# Patient Record
Sex: Male | Born: 1970 | Race: Black or African American | Hispanic: No | Marital: Single | Smoking: Never smoker
Health system: Southern US, Community
[De-identification: ages and names within clinical notes are randomized; demographics above are authoritative.]

## PROBLEM LIST (undated history)

## (undated) DIAGNOSIS — E669 Obesity, unspecified: Secondary | ICD-10-CM

## (undated) DIAGNOSIS — G629 Polyneuropathy, unspecified: Secondary | ICD-10-CM

## (undated) DIAGNOSIS — I10 Essential (primary) hypertension: Secondary | ICD-10-CM

## (undated) HISTORY — PX: OTHER SURGICAL HISTORY: SHX169

---

## 2010-06-26 ENCOUNTER — Emergency Department: Payer: Self-pay | Admitting: Unknown Physician Specialty

## 2014-06-05 ENCOUNTER — Ambulatory Visit: Payer: Self-pay | Admitting: Internal Medicine

## 2014-06-05 LAB — CBC WITH DIFFERENTIAL/PLATELET
BASOS PCT: 0.5 %
Basophil #: 0 10*3/uL (ref 0.0–0.1)
EOS ABS: 0.1 10*3/uL (ref 0.0–0.7)
EOS PCT: 1.1 %
HCT: 46.1 % (ref 40.0–52.0)
HGB: 15.1 g/dL (ref 13.0–18.0)
LYMPHS ABS: 1.3 10*3/uL (ref 1.0–3.6)
Lymphocyte %: 15.7 %
MCH: 26.5 pg (ref 26.0–34.0)
MCHC: 32.7 g/dL (ref 32.0–36.0)
MCV: 81 fL (ref 80–100)
MONO ABS: 0.5 x10 3/mm (ref 0.2–1.0)
Monocyte %: 5.7 %
Neutrophil #: 6.1 10*3/uL (ref 1.4–6.5)
Neutrophil %: 77 %
PLATELETS: 154 10*3/uL (ref 150–440)
RBC: 5.68 10*6/uL (ref 4.40–5.90)
RDW: 13.9 % (ref 11.5–14.5)
WBC: 7.9 10*3/uL (ref 3.8–10.6)

## 2014-06-05 LAB — COMPREHENSIVE METABOLIC PANEL
AST: 28 U/L (ref 15–37)
Albumin: 3.9 g/dL (ref 3.4–5.0)
Alkaline Phosphatase: 67 U/L
Anion Gap: 6 — ABNORMAL LOW (ref 7–16)
BUN: 14 mg/dL (ref 7–18)
Bilirubin,Total: 0.7 mg/dL (ref 0.2–1.0)
CHLORIDE: 98 mmol/L (ref 98–107)
CO2: 32 mmol/L (ref 21–32)
Calcium, Total: 9.2 mg/dL (ref 8.5–10.1)
Creatinine: 0.8 mg/dL (ref 0.60–1.30)
EGFR (African American): >60
GLUCOSE: 119 mg/dL — AB (ref 65–99)
OSMOLALITY: 274 (ref 275–301)
Potassium: 4 mmol/L (ref 3.5–5.1)
SGPT (ALT): 34 U/L (ref 12–78)
SODIUM: 136 mmol/L (ref 136–145)
Total Protein: 9.1 g/dL — ABNORMAL HIGH (ref 6.4–8.2)

## 2014-06-05 LAB — URINALYSIS, COMPLETE
BILIRUBIN, UR: NEGATIVE
Bacteria: NEGATIVE
Blood: NEGATIVE
GLUCOSE, UR: NEGATIVE mg/dL (ref 0–75)
Ketone: NEGATIVE
Leukocyte Esterase: NEGATIVE
NITRITE: NEGATIVE
Ph: 6 (ref 4.5–8.0)
Protein: NEGATIVE
Specific Gravity: 1.01 (ref 1.003–1.030)

## 2014-06-05 LAB — MAGNESIUM: Magnesium: 1.7 mg/dL — ABNORMAL LOW

## 2017-06-06 ENCOUNTER — Ambulatory Visit (INDEPENDENT_AMBULATORY_CARE_PROVIDER_SITE_OTHER): Payer: BLUE CROSS/BLUE SHIELD

## 2017-06-06 ENCOUNTER — Ambulatory Visit
Admission: EM | Admit: 2017-06-06 | Discharge: 2017-06-06 | Disposition: A | Discharge status: Home / Self Care | Payer: BLUE CROSS/BLUE SHIELD | Attending: Family Medicine | Admitting: Family Medicine

## 2017-06-06 DIAGNOSIS — S2231XA Fracture of one rib, right side, initial encounter for closed fracture: Secondary | ICD-10-CM

## 2017-06-06 DIAGNOSIS — R0782 Intercostal pain: Secondary | ICD-10-CM

## 2017-06-06 HISTORY — DX: Essential (primary) hypertension: I10

## 2017-06-06 HISTORY — DX: Obesity, unspecified: E66.9

## 2017-06-06 LAB — URINALYSIS, COMPLETE (UACMP) WITH MICROSCOPIC
Bacteria, UA: NONE SEEN
Bilirubin Urine: NEGATIVE
Glucose, UA: NEGATIVE mg/dL
Hgb urine dipstick: NEGATIVE
Ketones, ur: NEGATIVE mg/dL
Leukocytes, UA: NEGATIVE
Nitrite: NEGATIVE
Protein, ur: NEGATIVE mg/dL
RBC / HPF: NONE SEEN RBC/hpf (ref 0–5)
Specific Gravity, Urine: 1.025 (ref 1.005–1.030)
Squamous Epithelial / LPF: NONE SEEN
pH: 5.5 (ref 5.0–8.0)

## 2017-06-06 MED ORDER — METAXALONE 800 MG PO TABS: 800.0000 mg | ORAL_TABLET | Freq: Three times a day (TID) | ORAL | 0 refills | Status: DC

## 2017-06-06 MED ORDER — NAPROXEN 500 MG PO TABS: 500.0000 mg | ORAL_TABLET | Freq: Two times a day (BID) | ORAL | 0 refills | Status: DC

## 2017-06-06 NOTE — ED Provider Notes (Signed)
CSN: [id number]     Arrival date & time 06/06/17  1191 History   First MD Initiated Contact with Patient 06/06/17 [phone]     Chief Complaint  Patient presents with  . Rib Injury    right side   (Consider location/radiation/quality/duration/timing/severity/associated sxs/prior Treatment) HPI  This is a 46 year old male who is morbidly obese complains of right sided lower rib pain on Sunday and is intensified over the next couple of days. It does not seem to radiate. He indicates that it is in the mid axillary line. He states it is much worse when he walks and certain motions.Turning over in bed is very painful.  He does not remember any specific injury that may have precipitated his pain. Characterizes as a stabbing pain. When he sits down and is quiet he gets some relief. Deep inspiration does not seem to worsen and he has had no difficulty with breathing.        Past Medical History:  Diagnosis Date  . Hypertension   . Obesity    History reviewed. No pertinent surgical history. Family History  Problem Relation Age of Onset  . Diabetes Father   . Hypertension Father    Social History  Substance Use Topics  . Smoking status: Never Smoker  . Smokeless tobacco: Never Used  . Alcohol use Yes    Review of Systems  Constitutional: Positive for activity change. Negative for appetite change, chills, fatigue and fever.  Respiratory: Negative for cough and shortness of breath.   Musculoskeletal: Positive for myalgias.  All other systems reviewed and are negative.   Allergies  Sulfa antibiotics  Home Medications   Prior to Admission medications   Medication Sig Start Date End Date Taking? Authorizing Provider  amLODipine-benazepril (LOTREL) 10-20 MG capsule Take 1 capsule by mouth daily.   Yes [provider]  metaxalone (SKELAXIN) 800 MG tablet Take 1 tablet (800 mg total) by mouth 3 (three) times daily. 06/06/17   Lutricia Feil, PA-C  naproxen (NAPROSYN) 500 MG  tablet Take 1 tablet (500 mg total) by mouth 2 (two) times daily with a meal. 06/06/17   Lutricia Feil, PA-C   Meds Ordered and Administered this Visit  Medications - No data to display  BP (!) 171/95 (BP Location: Left Arm)   Pulse 78   Temp 98.2 F (36.8 C) (Oral)   Resp 20   Ht 6\' 1"  (1.854 m)   Wt (!) 580 lb (263.1 kg)   SpO2 100%   BMI 76.52 kg/m  No data found.   Physical Exam  Constitutional: He is oriented to person, place, and time. He appears well-developed and well-nourished. No distress.  HENT:  Head: Normocephalic.  Eyes: Pupils are equal, round, and reactive to light.  Neck: Normal range of motion.  Musculoskeletal: Normal range of motion. He exhibits tenderness.  Mild tenderness is elicited over the lower rib cage in the mid to anterior axillary line. Left lateral flexion reproduces tenderness on the right lower ribs as does thoracic rotation. There is no CVA tenderness. The patient is difficult to examine due to the obesity.  Neurological: He is alert and oriented to person, place, and time.  Skin: Skin is warm and dry. He is not diaphoretic.  Psychiatric: He has a normal mood and affect. His behavior is normal. Judgment and thought content normal.  Nursing note and vitals reviewed.   Urgent Care Course     Procedures (including critical care time)  Labs Review Labs Reviewed  URINALYSIS, COMPLETE (UACMP) WITH MICROSCOPIC    Imaging Review Dg Ribs Unilateral W/chest Right  Result Date: 06/06/2017 CLINICAL DATA:  Right anterior rib pain.  No injury. EXAM: RIGHT RIBS AND CHEST - 3+ VIEW COMPARISON:  None. FINDINGS: Mild irregularity in the anterior right tenth rib may reflect nondisplaced fracture. No associated effusion or pneumothorax. Lungs are clear. Heart is normal size. IMPRESSION: Possible nondisplaced fracture in the anterior right tenth rib. Electronically Signed   By: Charlett NoseKevin  Dover M.D.   On: 06/06/2017 10:47     Visual Acuity Review  Right  Eye Distance:   Left Eye Distance:   Bilateral Distance:    Right Eye Near:   Left Eye Near:    Bilateral Near:         MDM   1. Closed fracture of one rib of right side, initial encounter    New Prescriptions   METAXALONE (SKELAXIN) 800 MG TABLET    Take 1 tablet (800 mg total) by mouth 3 (three) times daily.   NAPROXEN (NAPROSYN) 500 MG TABLET    Take 1 tablet (500 mg total) by mouth 2 (two) times daily with a meal.  Plan: 1. Test/x-ray results and diagnosis reviewed with patient 2. rx as per orders; risks, benefits, potential side effects reviewed with patient 3. Recommend supportive treatment with Rest and symptom avoidance. Patient was told he must cough and deep breathe frequently to prevent pneumonia or pneumonitis. If he continues to have problems he should follow-up with his primary care physician. 4. F/u prn if symptoms worsen or don't improve     Lutricia FeilRoemer, William P, PA-C 06/06/17 1123

## 2017-06-06 NOTE — ED Triage Notes (Signed)
Pt c/o right sided rib pain that started on Sunday and it intensified over the next couple of days. The pain is worse when he walks, its a stabbing pain on his side. Sitting down he gets some relief.

## 2017-06-10 ENCOUNTER — Telehealth: Payer: Self-pay | Admitting: *Deleted

## 2017-06-10 NOTE — Telephone Encounter (Signed)
Patient called requesting a detailed work note releasing him for duty as per the request of his employer. Patient was given a standard return to work note that did not state no restrictions. Informed patient that he would have to be re assessed by one of our providers since Pul[place]emer PA is not working today. Patient confirmed understanding of information.

## 2017-07-18 ENCOUNTER — Encounter: Payer: Self-pay | Admitting: Emergency Medicine

## 2017-07-18 ENCOUNTER — Ambulatory Visit
Admission: EM | Admit: 2017-07-18 | Discharge: 2017-07-18 | Disposition: A | Discharge status: Home / Self Care | Payer: BLUE CROSS/BLUE SHIELD | Attending: Family Medicine | Admitting: Family Medicine

## 2017-07-18 DIAGNOSIS — I10 Essential (primary) hypertension: Secondary | ICD-10-CM | POA: Diagnosis not present

## 2017-07-18 DIAGNOSIS — M62830 Muscle spasm of back: Secondary | ICD-10-CM | POA: Diagnosis not present

## 2017-07-18 MED ORDER — MELOXICAM 15 MG PO TABS: 15.0000 mg | ORAL_TABLET | Freq: Every day | ORAL | 1 refills | Status: DC

## 2017-07-18 MED ORDER — KETOROLAC TROMETHAMINE 60 MG/2ML IM SOLN: 60.0000 mg | Freq: Once | INTRAMUSCULAR | Status: DC

## 2017-07-18 MED ORDER — KETOROLAC TROMETHAMINE 60 MG/2ML IM SOLN
60.0000 mg | Freq: Once | INTRAMUSCULAR | Status: AC
  Administered 2017-07-18: 60 mg via INTRAMUSCULAR

## 2017-07-18 MED ORDER — METAXALONE 800 MG PO TABS: 800.0000 mg | ORAL_TABLET | Freq: Three times a day (TID) | ORAL | 0 refills | Status: DC

## 2017-07-18 NOTE — ED Provider Notes (Signed)
[facility]    CSN: [id number] Arrival date & time: 07/18/17  1034     History   Chief Complaint Chief Complaint  Patient presents with  . Back Pain    right sided    HPI Bryan Sloan is a [age] y.o. male.   Patient is obese black male who apparently fractured a rib couple weeks ago. Ribs fracture according to him was documented on x-ray. He states he fractured his ribs just was routine exercise. Reports for the several days rib is improving but about almost 2 weeks ago he went to a wedding since then he's has increased pain in the rib on the right side and mostly he feels his muscle spasms. He feels that he'll do something and his started having pain all along the right rib area in the back. He attributed to muscle spasms. By Friday Worse and over the weekend he continued to get worse until that he was unable to work for couple days last week. He states that he came to the office today because he still having pain the pain is improving a little bit but he still having some difficulty with the pain but he is able to walk 90 states. He states pain was so bad earlier this week he could not walk. Denies any shortness of breath doesn't believe he's had any new trauma to the ribs and also doesn't read or feel that the ribs needs to be re-x-rayed either. States some relief from muscle spasms.   The history is provided by the patient. No language interpreter was used.  Back Pain  Location:  Thoracic spine Quality:  Cramping Radiates to:  Does not radiate Pain severity:  Moderate Pain is:  Unable to specify Onset quality:  Sudden Duration:  7 days Timing:  Constant Progression:  Improving Chronicity:  New Relieved by:  Nothing Worsened by:  Nothing Ineffective treatments:  Ibuprofen, NSAIDs and OTC medications   Past Medical History:  Diagnosis Date  . Hypertension   . Obesity     There are no active problems to display for this patient.   History reviewed. No  pertinent surgical history.     Home Medications    Prior to Admission medications   Medication Sig Start Date End Date Taking? Authorizing Provider  amLODipine-benazepril (LOTREL) 10-20 MG capsule Take 1 capsule by mouth daily.    [provider]  meloxicam (MOBIC) 15 MG tablet Take 1 tablet (15 mg total) by mouth daily. Do not take with Naprosyn or Motrin 07/18/17   Hassan RowanWade, Charlisha Market, MD  metaxalone (SKELAXIN) 800 MG tablet Take 1 tablet (800 mg total) by mouth 3 (three) times daily. 07/18/17   Hassan RowanWade, Dorwin Fitzhenry, MD  naproxen (NAPROSYN) 500 MG tablet Take 1 tablet (500 mg total) by mouth 2 (two) times daily with a meal. 06/06/17   Lutricia Feiloemer, William P, PA-C    Family History Family History  Problem Relation Age of Onset  . Diabetes Father   . Hypertension Father     Social History Social History  Substance Use Topics  . Smoking status: Never Smoker  . Smokeless tobacco: Never Used  . Alcohol use Yes     Allergies   Sulfa antibiotics   Review of Systems Review of Systems  Musculoskeletal: Positive for back pain.  All other systems reviewed and are negative.    Physical Exam Triage Vital Signs ED Triage Vitals  Enc Vitals Group     BP 07/18/17 1100 (!) 164/99  Pulse Rate 07/18/17 1100 99     Resp 07/18/17 1100 16     Temp 07/18/17 1100 98.7 F (37.1 C)     Temp Source 07/18/17 1100 Oral     SpO2 07/18/17 1100 97 %     Weight 07/18/17 1058 (!) 590 lb (267.6 kg)     Height 07/18/17 1058 6\' 1"  (1.854 m)     Head Circumference --      Peak Flow --      Pain Score 07/18/17 1058 2     Pain Loc --      Pain Edu? --      Excl. in GC? --    No data found.   Updated Vital Signs BP (!) 164/99 (BP Location: Left Arm)   Pulse 99   Temp 98.7 F (37.1 C) (Oral)   Resp 16   Ht 6\' 1"  (1.854 m)   Wt (!) 590 lb (267.6 kg)   SpO2 97%   BMI 77.84 kg/m   Visual Acuity Right Eye Distance:   Left Eye Distance:   Bilateral Distance:    Right Eye Near:   Left Eye  Near:    Bilateral Near:     Physical Exam  Constitutional: He is oriented to person, place, and time.  Non-toxic appearance. He does not have a sickly appearance. He does not appear ill. No distress.  Patient is a morbidly obese black male with hypertension and obesity  HENT:  Head: Normocephalic and atraumatic.  Right Ear: External ear normal.  Left Ear: External ear normal.  Eyes: Pupils are equal, round, and reactive to light. EOM are normal.  Neck: Normal range of motion.  Pulmonary/Chest: Effort normal.  Musculoskeletal: He exhibits tenderness.       Thoracic back: He exhibits spasm.       Back:       Arms: Neurological: He is alert and oriented to person, place, and time.  Skin: Skin is warm.  Psychiatric: He has a normal mood and affect.  Vitals reviewed.    UC Treatments / Results  Labs (all labs ordered are listed, but only abnormal results are displayed) Labs Reviewed - No data to display  EKG  EKG Interpretation None       Radiology No results found.  Procedures Procedures (including critical care time)  Medications Ordered in UC Medications  ketorolac (TORADOL) injection 60 mg (not administered)  ketorolac (TORADOL) injection 60 mg (not administered)     Initial Impression / Assessment and Plan / UC Course  I have reviewed the triage vital signs and the nursing notes.  Pertinent labs & imaging results that were available during my care of the patient were reviewed by me and considered in my medical decision making (see chart for details).   patient will be given 60 Toradol for the muscle spasm renew his Skelaxin 800 mg 3 times a day and place him on Mobic 15 mg of Naprosyn or Motrin follow-up with PCP if not better in a week. He also reports that he needed clearance for work as far as no strictures tried explained to him that I wouldn't going to put him on any restrictions anyway but I gave him a note for work inside. Half feel that there is no  restrictions on him.  Final Clinical Impressions(s) / UC Diagnoses   Final diagnoses:  Muscle spasm of back    New Prescriptions New Prescriptions   MELOXICAM (MOBIC) 15 MG TABLET    Take  1 tablet (15 mg total) by mouth daily. Do not take with Naprosyn or Motrin    Note: This dictation was prepared with Dragon dictation along with smaller phrase technology. Any transcriptional errors that result from this process are unintentional.   Hassan RowanWade, Jonelle Bann, MD 07/18/17 1201

## 2017-07-18 NOTE — ED Notes (Signed)
Patient shows no signs of adverse reaction to medication at this time.  

## 2017-07-18 NOTE — ED Triage Notes (Signed)
Patient reports history of fractured rib on the right side of the chest.  Patient c/o right side pain that radiates to his back last Wed. Patient also reports muscle spasms.

## 2019-01-11 IMAGING — CR DG RIBS W/ CHEST 3+V*R*
4 series · 4 of 4 positions shown · non-contrast
Comparison: None.

CLINICAL DATA: Right anterior rib pain.  No injury.

EXAM:
RIGHT RIBS AND CHEST - 3+ VIEW

[chest pa (1 of 2)]
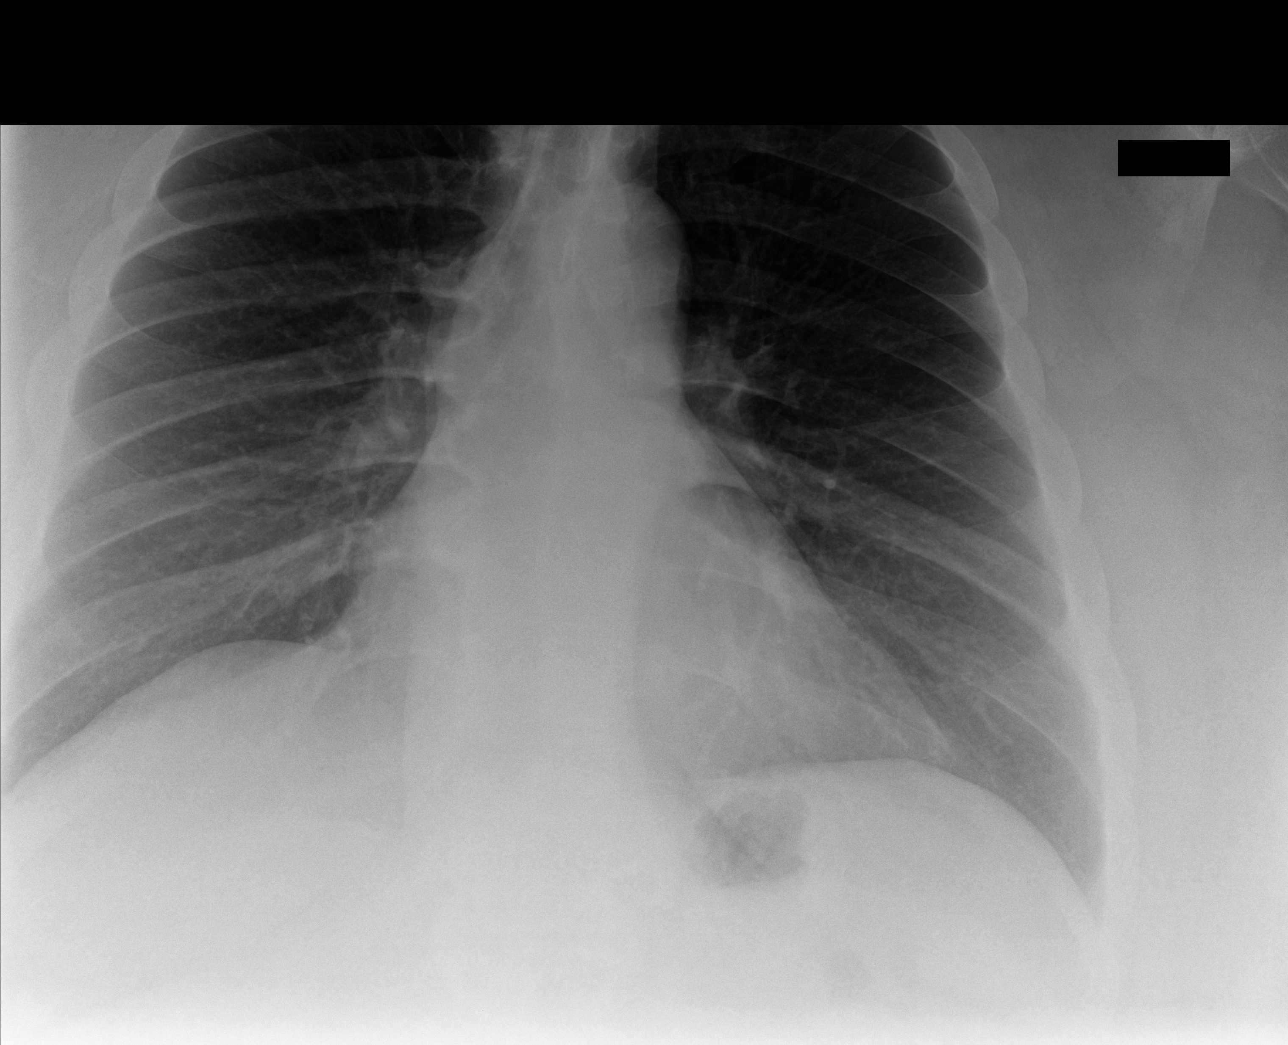

[rib pa]
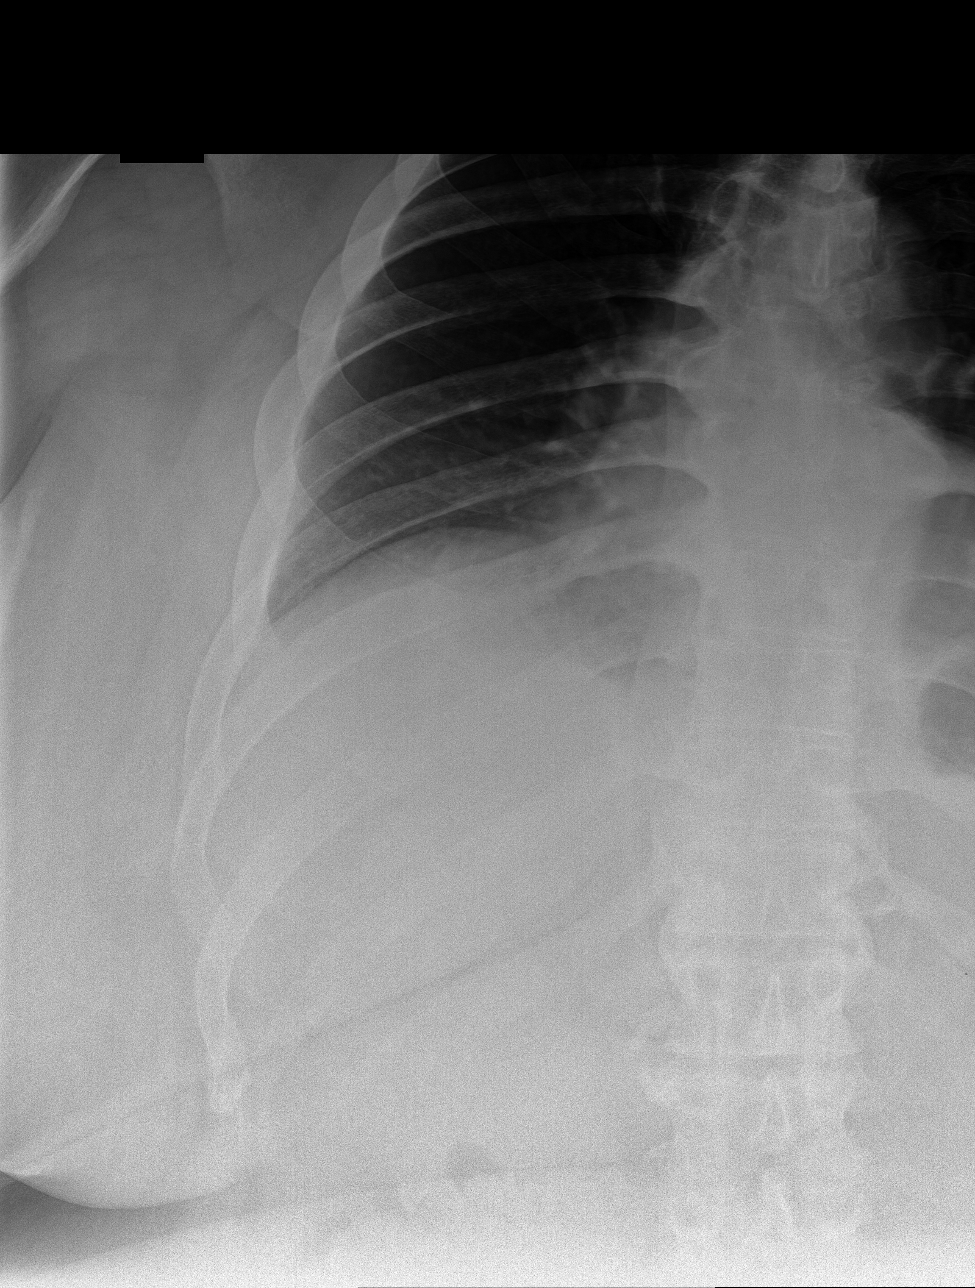

[rib obl]
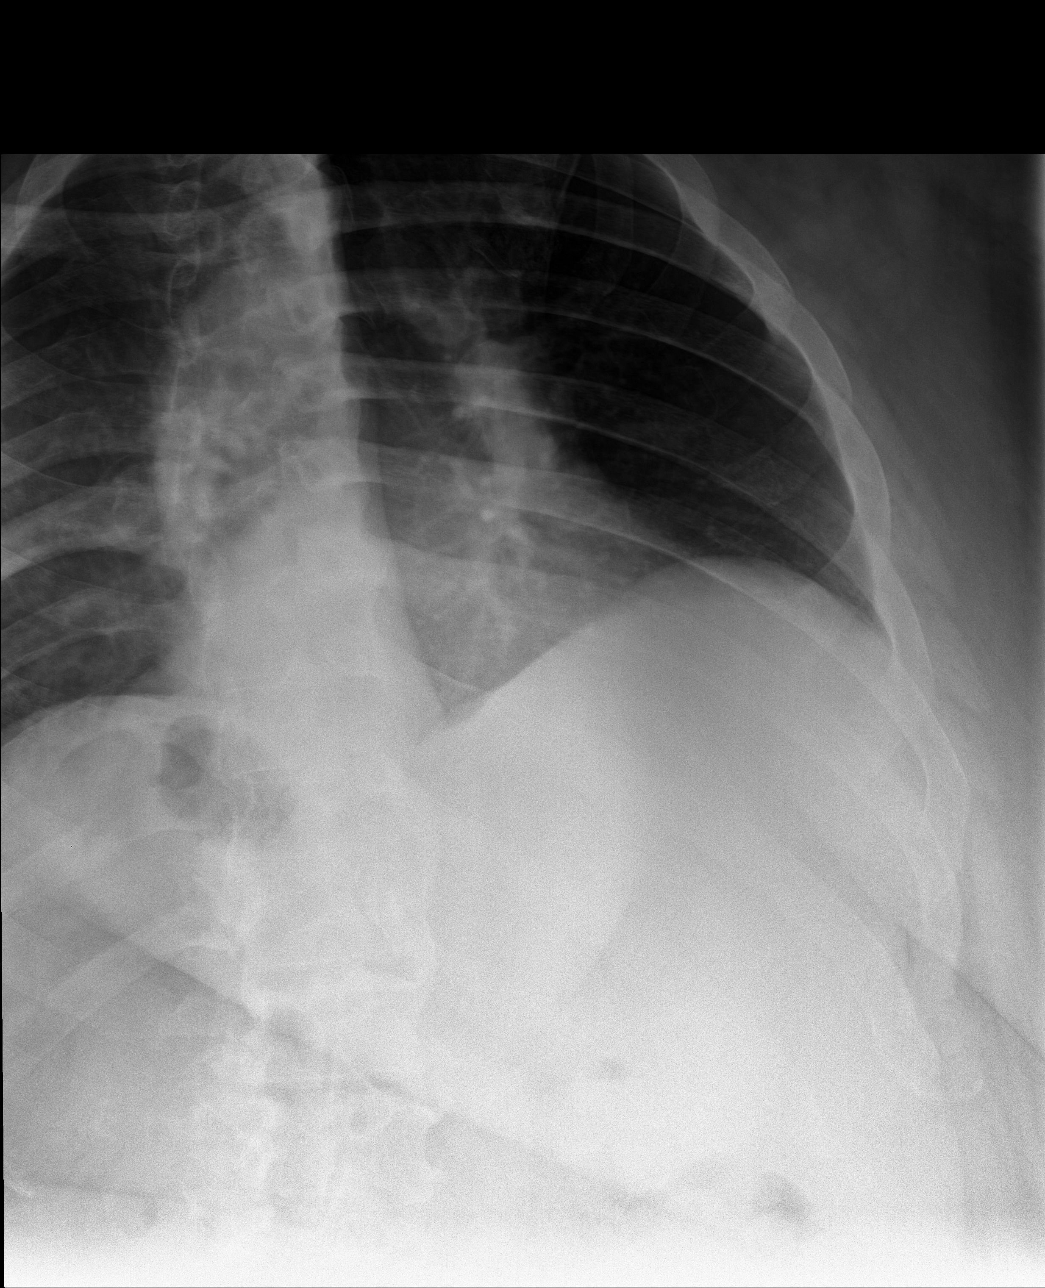

[chest pa (2 of 2)]
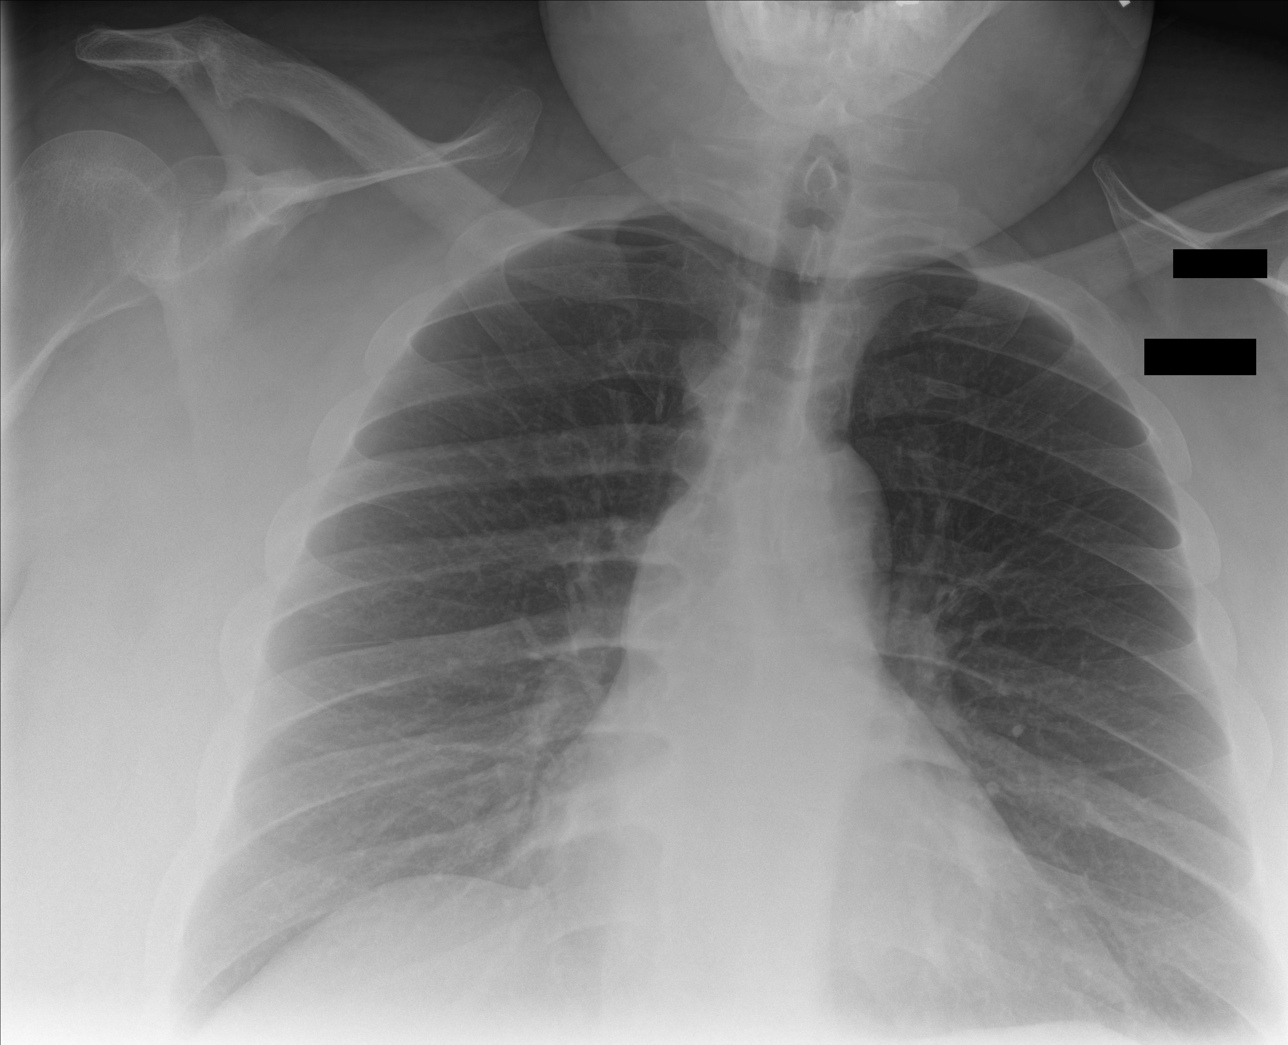

[4 of 4 positions shown; findings below may reference images not displayed]

FINDINGS: Mild irregularity in the anterior right tenth rib may reflect
nondisplaced fracture. No associated effusion or pneumothorax. Lungs
are clear. Heart is normal size.
IMPRESSION: Possible nondisplaced fracture in the anterior right tenth rib.

## 2022-02-02 ENCOUNTER — Ambulatory Visit: Payer: Self-pay | Admitting: Family Medicine

## 2022-03-05 ENCOUNTER — Ambulatory Visit: Payer: Self-pay | Admitting: Family Medicine

## 2024-12-26 ENCOUNTER — Emergency Department

## 2024-12-26 ENCOUNTER — Inpatient Hospital Stay
Admission: EM | Admit: 2024-12-26 | Discharge: 2024-12-31 | DRG: 189 | Disposition: A | Discharge status: Home / Self Care | Attending: Internal Medicine | Admitting: Internal Medicine

## 2024-12-26 ENCOUNTER — Other Ambulatory Visit: Payer: Self-pay

## 2024-12-26 DIAGNOSIS — E662 Morbid (severe) obesity with alveolar hypoventilation: Secondary | ICD-10-CM | POA: Diagnosis present

## 2024-12-26 DIAGNOSIS — I129 Hypertensive chronic kidney disease with stage 1 through stage 4 chronic kidney disease, or unspecified chronic kidney disease: Secondary | ICD-10-CM | POA: Diagnosis present

## 2024-12-26 DIAGNOSIS — Z7982 Long term (current) use of aspirin: Secondary | ICD-10-CM

## 2024-12-26 DIAGNOSIS — I739 Peripheral vascular disease, unspecified: Secondary | ICD-10-CM | POA: Diagnosis present

## 2024-12-26 DIAGNOSIS — R000 Tachycardia, unspecified: Secondary | ICD-10-CM | POA: Diagnosis present

## 2024-12-26 DIAGNOSIS — E872 Acidosis, unspecified: Secondary | ICD-10-CM | POA: Diagnosis present

## 2024-12-26 DIAGNOSIS — I2489 Other forms of acute ischemic heart disease: Secondary | ICD-10-CM | POA: Diagnosis present

## 2024-12-26 DIAGNOSIS — I10 Essential (primary) hypertension: Secondary | ICD-10-CM | POA: Diagnosis not present

## 2024-12-26 DIAGNOSIS — Z79899 Other long term (current) drug therapy: Secondary | ICD-10-CM

## 2024-12-26 DIAGNOSIS — R079 Chest pain, unspecified: Principal | ICD-10-CM

## 2024-12-26 DIAGNOSIS — I5A Non-ischemic myocardial injury (non-traumatic): Secondary | ICD-10-CM | POA: Diagnosis present

## 2024-12-26 DIAGNOSIS — E66813 Obesity, class 3: Secondary | ICD-10-CM

## 2024-12-26 DIAGNOSIS — Z713 Dietary counseling and surveillance: Secondary | ICD-10-CM

## 2024-12-26 DIAGNOSIS — R0902 Hypoxemia: Secondary | ICD-10-CM

## 2024-12-26 DIAGNOSIS — Z23 Encounter for immunization: Secondary | ICD-10-CM

## 2024-12-26 DIAGNOSIS — I480 Paroxysmal atrial fibrillation: Secondary | ICD-10-CM | POA: Diagnosis present

## 2024-12-26 DIAGNOSIS — G629 Polyneuropathy, unspecified: Secondary | ICD-10-CM | POA: Diagnosis present

## 2024-12-26 DIAGNOSIS — Z6841 Body Mass Index (BMI) 40.0 and over, adult: Secondary | ICD-10-CM

## 2024-12-26 DIAGNOSIS — E876 Hypokalemia: Secondary | ICD-10-CM | POA: Diagnosis not present

## 2024-12-26 DIAGNOSIS — Z8249 Family history of ischemic heart disease and other diseases of the circulatory system: Secondary | ICD-10-CM

## 2024-12-26 DIAGNOSIS — R7989 Other specified abnormal findings of blood chemistry: Secondary | ICD-10-CM | POA: Diagnosis not present

## 2024-12-26 DIAGNOSIS — J9601 Acute respiratory failure with hypoxia: Principal | ICD-10-CM | POA: Diagnosis present

## 2024-12-26 DIAGNOSIS — E871 Hypo-osmolality and hyponatremia: Secondary | ICD-10-CM | POA: Diagnosis present

## 2024-12-26 DIAGNOSIS — Z1152 Encounter for screening for COVID-19: Secondary | ICD-10-CM

## 2024-12-26 DIAGNOSIS — N182 Chronic kidney disease, stage 2 (mild): Secondary | ICD-10-CM | POA: Diagnosis present

## 2024-12-26 DIAGNOSIS — I959 Hypotension, unspecified: Secondary | ICD-10-CM | POA: Diagnosis present

## 2024-12-26 DIAGNOSIS — Z791 Long term (current) use of non-steroidal anti-inflammatories (NSAID): Secondary | ICD-10-CM

## 2024-12-26 DIAGNOSIS — Z833 Family history of diabetes mellitus: Secondary | ICD-10-CM

## 2024-12-26 HISTORY — DX: Polyneuropathy, unspecified: G62.9

## 2024-12-26 HISTORY — DX: Morbid (severe) obesity due to excess calories: E66.01

## 2024-12-26 LAB — RESP PANEL BY RT-PCR (RSV, FLU A&B, COVID)  RVPGX2
Influenza A by PCR: NEGATIVE
Influenza B by PCR: NEGATIVE
Resp Syncytial Virus by PCR: NEGATIVE
SARS Coronavirus 2 by RT PCR: NEGATIVE

## 2024-12-26 LAB — CBC WITH DIFFERENTIAL/PLATELET
Abs Immature Granulocytes: 0.03 K/uL (ref 0.00–0.07)
Basophils Absolute: 0 K/uL (ref 0.0–0.1)
Basophils Relative: 0 %
Eosinophils Absolute: 0 K/uL (ref 0.0–0.5)
Eosinophils Relative: 0 %
HCT: 44.5 % (ref 39.0–52.0)
Hemoglobin: 14.4 g/dL (ref 13.0–17.0)
Immature Granulocytes: 0 %
Lymphocytes Relative: 21 %
Lymphs Abs: 1.5 K/uL (ref 0.7–4.0)
MCH: 26 pg (ref 26.0–34.0)
MCHC: 32.4 g/dL (ref 30.0–36.0)
MCV: 80.5 fL (ref 80.0–100.0)
Monocytes Absolute: 0.7 K/uL (ref 0.1–1.0)
Monocytes Relative: 10 %
Neutro Abs: 4.9 K/uL (ref 1.7–7.7)
Neutrophils Relative %: 69 %
Platelets: 200 K/uL (ref 150–400)
RBC: 5.53 MIL/uL (ref 4.22–5.81)
RDW: 13.2 % (ref 11.5–15.5)
WBC: 7.2 K/uL (ref 4.0–10.5)
nRBC: 0 % (ref 0.0–0.2)

## 2024-12-26 LAB — T4, FREE: Free T4: 1.35 ng/dL (ref 0.80–2.00)

## 2024-12-26 LAB — PROTIME-INR
INR: 1 (ref 0.8–1.2)
Prothrombin Time: 14 s (ref 11.4–15.2)

## 2024-12-26 LAB — APTT: aPTT: 27 s (ref 24–36)

## 2024-12-26 LAB — COMPREHENSIVE METABOLIC PANEL WITH GFR
ALT: 18 U/L (ref 0–44)
AST: 40 U/L (ref 15–41)
Albumin: 4 g/dL (ref 3.5–5.0)
Alkaline Phosphatase: 59 U/L (ref 38–126)
Anion gap: 18 — ABNORMAL HIGH (ref 5–15)
BUN: 18 mg/dL (ref 6–20)
CO2: 24 mmol/L (ref 22–32)
Calcium: 9.5 mg/dL (ref 8.9–10.3)
Chloride: 90 mmol/L — ABNORMAL LOW (ref 98–111)
Creatinine, Ser: 1.21 mg/dL (ref 0.61–1.24)
GFR, Estimated: >60 mL/min
Glucose, Bld: 162 mg/dL — ABNORMAL HIGH (ref 70–99)
Potassium: 3.3 mmol/L — ABNORMAL LOW (ref 3.5–5.1)
Sodium: 132 mmol/L — ABNORMAL LOW (ref 135–145)
Total Bilirubin: 1 mg/dL (ref 0.0–1.2)
Total Protein: 8.7 g/dL — ABNORMAL HIGH (ref 6.5–8.1)

## 2024-12-26 LAB — BLOOD GAS, VENOUS
Acid-Base Excess: 3.2 mmol/L — ABNORMAL HIGH (ref 0.0–2.0)
Bicarbonate: 28.5 mmol/L — ABNORMAL HIGH (ref 20.0–28.0)
O2 Saturation: 69.5 %
Patient temperature: 37
pCO2, Ven: 45 mmHg (ref 44–60)
pH, Ven: 7.41 (ref 7.25–7.43)
pO2, Ven: 44 mmHg (ref 32–45)

## 2024-12-26 LAB — TROPONIN T, HIGH SENSITIVITY
Troponin T High Sensitivity: 23 ng/L — ABNORMAL HIGH (ref 0–19)
Troponin T High Sensitivity: 27 ng/L — ABNORMAL HIGH (ref 0–19)
Troponin T High Sensitivity: 27 ng/L — ABNORMAL HIGH (ref 0–19)

## 2024-12-26 LAB — TSH: TSH: 3.39 u[IU]/mL (ref 0.350–4.500)

## 2024-12-26 LAB — LACTIC ACID, PLASMA
Lactic Acid, Venous: 4.6 mmol/L (ref 0.5–1.9)
Lactic Acid, Venous: 1.9 mmol/L (ref 0.5–1.9)
Lactic Acid, Venous: 1.5 mmol/L (ref 0.5–1.9)

## 2024-12-26 LAB — LIPASE, BLOOD: Lipase: 16 U/L (ref 11–51)

## 2024-12-26 LAB — PRO BRAIN NATRIURETIC PEPTIDE: Pro Brain Natriuretic Peptide: <50 pg/mL

## 2024-12-26 LAB — D-DIMER, QUANTITATIVE: D-Dimer, Quant: 1.16 ug{FEU}/mL — ABNORMAL HIGH (ref 0.00–0.50)

## 2024-12-26 LAB — MAGNESIUM: Magnesium: 1.9 mg/dL (ref 1.7–2.4)

## 2024-12-26 MED ORDER — DM-GUAIFENESIN ER 30-600 MG PO TB12: 1.0000 | ORAL_TABLET | Freq: Two times a day (BID) | ORAL | Status: DC | PRN

## 2024-12-26 MED ORDER — SODIUM CHLORIDE 0.9 % IV BOLUS: 250.0000 mL | Freq: Once | INTRAVENOUS | Status: DC

## 2024-12-26 MED ORDER — LORAZEPAM 1 MG PO TABS
1.0000 mg | ORAL_TABLET | Freq: Once | ORAL | Status: AC
  Administered 2024-12-26: 1 mg via ORAL
  Filled 2024-12-26: qty 1

## 2024-12-26 MED ORDER — ACETAMINOPHEN 325 MG PO TABS
650.0000 mg | ORAL_TABLET | Freq: Four times a day (QID) | ORAL | Status: DC | PRN
  Administered 2024-12-29: 650 mg via ORAL
  Filled 2024-12-26: qty 2

## 2024-12-26 MED ORDER — NAPROXEN 500 MG PO TABS
500.0000 mg | ORAL_TABLET | Freq: Two times a day (BID) | ORAL | Status: DC
  Administered 2024-12-27 – 2024-12-31 (×9): 500 mg via ORAL
  Filled 2024-12-26 (×10): qty 1

## 2024-12-26 MED ORDER — SODIUM CHLORIDE 0.9 % IV SOLN: INTRAVENOUS | Status: DC

## 2024-12-26 MED ORDER — ASPIRIN 81 MG PO CHEW: 324.0000 mg | CHEWABLE_TABLET | Freq: Once | ORAL | Status: DC

## 2024-12-26 MED ORDER — MORPHINE SULFATE (PF) 4 MG/ML IV SOLN: 4.0000 mg | INTRAVENOUS | Status: DC | PRN

## 2024-12-26 MED ORDER — POTASSIUM CHLORIDE CRYS ER 20 MEQ PO TBCR
40.0000 meq | EXTENDED_RELEASE_TABLET | Freq: Once | ORAL | Status: AC
  Administered 2024-12-26: 40 meq via ORAL
  Filled 2024-12-26: qty 2

## 2024-12-26 MED ORDER — ENOXAPARIN SODIUM 120 MG/0.8ML IJ SOSY
120.0000 mg | PREFILLED_SYRINGE | INTRAMUSCULAR | Status: DC
  Administered 2024-12-26: 120 mg via SUBCUTANEOUS
  Filled 2024-12-26: qty 0.8

## 2024-12-26 MED ORDER — ONDANSETRON HCL 4 MG/2ML IJ SOLN: 4.0000 mg | Freq: Three times a day (TID) | INTRAMUSCULAR | Status: DC | PRN

## 2024-12-26 MED ORDER — HYDRALAZINE HCL 20 MG/ML IJ SOLN: 5.0000 mg | INTRAMUSCULAR | Status: DC | PRN

## 2024-12-26 MED ORDER — MAGNESIUM SULFATE 2 GM/50ML IV SOLN
2.0000 g | Freq: Once | INTRAVENOUS | Status: AC
  Administered 2024-12-26: 2 g via INTRAVENOUS
  Filled 2024-12-26: qty 50

## 2024-12-26 MED ORDER — ASPIRIN 81 MG PO TBEC
81.0000 mg | DELAYED_RELEASE_TABLET | Freq: Every day | ORAL | Status: DC
  Administered 2024-12-27 – 2024-12-31 (×5): 81 mg via ORAL
  Filled 2024-12-26 (×5): qty 1

## 2024-12-26 MED ORDER — OXYCODONE-ACETAMINOPHEN 5-325 MG PO TABS
1.0000 | ORAL_TABLET | ORAL | Status: DC | PRN
  Administered 2024-12-29 – 2024-12-31 (×5): 1 via ORAL
  Filled 2024-12-26 (×5): qty 1

## 2024-12-26 MED ORDER — ALBUTEROL SULFATE (2.5 MG/3ML) 0.083% IN NEBU
2.5000 mg | INHALATION_SOLUTION | RESPIRATORY_TRACT | Status: DC | PRN
  Filled 2024-12-26: qty 3

## 2024-12-26 MED ORDER — LACTATED RINGERS IV BOLUS
1000.0000 mL | Freq: Once | INTRAVENOUS | Status: AC
  Administered 2024-12-26: 1000 mL via INTRAVENOUS

## 2024-12-26 NOTE — ED Notes (Signed)
 2 sets of blood cultures, purple, blue, grey, SST, Lt green, green,  and VBG sent to lab

## 2024-12-26 NOTE — ED Notes (Signed)
 [phone]. NIVEA wants to be contacted for updates. Coming back in the morning.

## 2024-12-26 NOTE — ED Notes (Signed)
 Nitropaste removed per Nicholaus. MD verbal orders

## 2024-12-26 NOTE — ED Notes (Signed)
 ED Provider at bedside.

## 2024-12-26 NOTE — ED Triage Notes (Signed)
 BIB [facility]. Called ems for SOB and angina. Afib RVR 110-200, no HX. NO PCP. NO formal DX other than HTN. 1 1/2 inch of nitro paste to LUC 170/110, P110/200, CBG 116, Afebrile.

## 2024-12-26 NOTE — ED Notes (Signed)
 Pt advised he was very uncomfortable in the bed. Pt was transitioned to a bariatric recliner. Pt was removed from oxygen during this transition. Pt O2 dropped to 88% on RA.

## 2024-12-26 NOTE — H&P (Addendum)
 " History and Physical    Bryan Sloan FMW:[id number] DOB: 11-24-1971 DOA: 12/26/2024  Referring MD/NP/PA:   PCP: Patient, No Pcp Per   Patient coming from:  The patient is coming from home.     Chief Complaint: SOB  HPI: Bryan Sloan is a 54 y.o. male with medical history significant of morbid obesity with BMI 96.98 (BW 333.4 KG), HTN, peripheral neuropathy, who presents with SOB.  Patient states that he has chronic exertional SOB at baseline, but his SOB has worsened today.  He also reports lightheadedness.  Patient has difficulty speaking in full sentence. Patient does not use oxygen at baseline, but was found to have acute respiratory distress with oxygen desaturation to 88% on room air, which improved to 94% on 4L oxygen.  Patient reports chest tightness which has resolved.  Denies active chest pain.  No cough, fever or chills.  No nausea, vomiting, diarrhea or abdominal pain.  No symptoms UTI.  No tenderness in the calf areas.  No recent fall or head injury.  No rectal bleeding or dark stool.  Per EMS report, pt had A fib with HR 110-200s, but no strips were available from EMS. 3 EKGs were done in ED, all showed sinus tachycardia with PAC. Pt did not have hypotension initially, but blood pressure dropped to 77/55 after giving 1.5 inch of nitroglycerin paste to left upper chest. The nitroglycerin paste is removed.  Patient was given 1 L LR bolus in ED, blood pressure improved to 109/54. Pt received 324 mg of ASA from EMS.   Data reviewed independently and ED Course: pt was found to have trop 23 --> 27, proBNP <50, WBC 7.2, GFR> 60, potassium 3.3, INR 1.0, PTT 27, negative PCR for COVID, flu and RSV, lactic acid 4.6.  Temperature normal, heart rate 114 --> 90s, RR 18.  VBG with pH 7.41, CO2 45, O2 44.  Chest x-ray showed cardiomegaly and a negative for infiltration.  Patient is placed in PCU for observation.  Lower extremity venous Doppler: 1. No evidence of deep venous thrombosis. 2.  Slightly limited examination due to body habitus with relatively poor visualization of the infrapopliteal vasculature.     EKG: I have personally reviewed.  3 EKG was done in ED.  Sinus rhythm, early R wave progression, PAC, nonspecific T wave change.   Review of Systems:   General: no fevers, chills, no body weight gain, has fatigue HEENT: no blurry vision, hearing changes or sore throat Respiratory: has dyspnea, no coughing, wheezing CV: Has chest tightness, no palpitations GI: no nausea, vomiting, abdominal pain, diarrhea, constipation GU: no dysuria, burning on urination, increased urinary frequency, hematuria  Ext: has trace leg edema Neuro: no unilateral weakness, numbness, or tingling, no vision change or hearing loss Skin: no rash, no skin tear. MSK: No muscle spasm, no deformity, no limitation of range of movement in spin Heme: No easy bruising.  Travel history: No recent long distant travel.   Allergy: Allergies[1]  Past Medical History:  Diagnosis Date   Hypertension    Morbid obesity (HCC)    Peripheral neuropathy     Past Surgical History:  Procedure Laterality Date   Left foot wart      Social History:  reports that he has never smoked. He has never used smokeless tobacco. He reports current alcohol use. He reports that he does not use drugs.  Family History:  Family History  Problem Relation Age of Onset   Diabetes Father  Hypertension Father      Prior to Admission medications  Medication Sig Start Date End Date Taking? Authorizing Provider  amLODipine-benazepril (LOTREL) 10-20 MG capsule Take 1 capsule by mouth daily.    [provider]  meloxicam  (MOBIC ) 15 MG tablet Take 1 tablet (15 mg total) by mouth daily. Do not take with Naprosyn  or Motrin 07/18/17   Desiderio Beagle, MD  metaxalone  (SKELAXIN ) 800 MG tablet Take 1 tablet (800 mg total) by mouth 3 (three) times daily. 07/18/17   Desiderio Beagle, MD  naproxen  (NAPROSYN ) 500 MG tablet Take 1  tablet (500 mg total) by mouth 2 (two) times daily with a meal. 06/06/17   Lacinda Elsie SQUIBB, PA-C    Physical Exam: Vitals:   12/26/24 1630 12/26/24 1700 12/26/24 1730 12/26/24 1953  BP: (!) 104/59 (!) 77/55 (!) 109/54   Pulse: (!) 107 99 (!) 108   Resp: 15 15 18    Temp:    98 F (36.7 C)  TempSrc:    Axillary  SpO2: 99% 94% 91%   Weight:       General: Has moderate acute respiratory distress, morbidly obese HEENT:       Eyes: PERRL, EOMI, no jaundice       ENT: No discharge from the ears and nose, no pharynx injection, no tonsillar enlargement.        Neck: No JVD, no bruit, no mass felt. Heme: No neck lymph node enlargement. Cardiac: S1/S2, RRR, No murmurs, No gallops or rubs. Respiratory: No rales, wheezing, rhonchi or rubs. GI: Soft, nondistended, nontender, no rebound pain, no organomegaly, BS present. GU: No hematuria Ext: has trace leg edema bilaterally. 1+DP/PT pulse bilaterally. Musculoskeletal: No joint deformities, No joint redness or warmth, no limitation of ROM in spin. Skin: No rashes.  Neuro: Alert, oriented X3, cranial nerves II-XII grossly intact, moves all extremities normally. Psych: Patient is not psychotic, no suicidal or hemocidal ideation.  Labs on Admission: I have personally reviewed following labs and imaging studies  CBC: Recent Labs  Lab 12/26/24 1603  WBC 7.2  NEUTROABS 4.9  HGB 14.4  HCT 44.5  MCV 80.5  PLT 200   Basic Metabolic Panel: Recent Labs  Lab 12/26/24 1603  NA 132*  K 3.3*  CL 90*  CO2 24  GLUCOSE 162*  BUN 18  CREATININE 1.21  CALCIUM 9.5  MG 1.9   GFR: CrCl cannot be calculated (Unknown ideal weight.). Liver Function Tests: Recent Labs  Lab 12/26/24 1603  AST 40  ALT 18  ALKPHOS 59  BILITOT 1.0  PROT 8.7*  ALBUMIN 4.0   Recent Labs  Lab 12/26/24 1603  LIPASE 16   No results for input(s): AMMONIA in the last 168 hours. Coagulation Profile: Recent Labs  Lab 12/26/24 1603  INR 1.0   Cardiac  Enzymes: No results for input(s): CKTOTAL, CKMB, CKMBINDEX, TROPONINI in the last 168 hours. BNP (last 3 results) Recent Labs    12/26/24 1603  PROBNP <50.0   HbA1C: No results for input(s): HGBA1C in the last 72 hours. CBG: No results for input(s): GLUCAP in the last 168 hours. Lipid Profile: No results for input(s): CHOL, HDL, LDLCALC, TRIG, CHOLHDL, LDLDIRECT in the last 72 hours. Thyroid Function Tests: Recent Labs    12/26/24 1603  TSH 3.390  FREET4 1.35   Anemia Panel: No results for input(s): VITAMINB12, FOLATE, FERRITIN, TIBC, IRON, RETICCTPCT in the last 72 hours. Urine analysis:    Component Value Date/Time   COLORURINE YELLOW 06/06/2017 1018  APPEARANCEUR CLEAR 06/06/2017 1018   APPEARANCEUR CLEAR 06/05/2014 1143   LABSPEC 1.025 06/06/2017 1018   LABSPEC 1.010 06/05/2014 1143   PHURINE 5.5 06/06/2017 1018   GLUCOSEU NEGATIVE 06/06/2017 1018   GLUCOSEU NEGATIVE 06/05/2014 1143   HGBUR NEGATIVE 06/06/2017 1018   BILIRUBINUR NEGATIVE 06/06/2017 1018   BILIRUBINUR NEGATIVE 06/05/2014 1143   KETONESUR NEGATIVE 06/06/2017 1018   PROTEINUR NEGATIVE 06/06/2017 1018   NITRITE NEGATIVE 06/06/2017 1018   LEUKOCYTESUR NEGATIVE 06/06/2017 1018   LEUKOCYTESUR NEGATIVE 06/05/2014 1143   Sepsis Labs: @LABRCNTIP (procalcitonin:4,lacticidven:4) ) Recent Results (from the past 240 hours)  Resp panel by RT-PCR (RSV, Flu A&B, Covid) Anterior Nasal Swab     Status: None   Collection Time: 12/26/24  4:03 PM   Specimen: Anterior Nasal Swab  Result Value Ref Range Status   SARS Coronavirus 2 by RT PCR NEGATIVE NEGATIVE Final    Comment: (NOTE) SARS-CoV-2 target nucleic acids are NOT DETECTED.  The SARS-CoV-2 RNA is generally detectable in upper respiratory specimens during the acute phase of infection. The lowest concentration of SARS-CoV-2 viral copies this assay can detect is 138 copies/mL. A negative result does not preclude  SARS-Cov-2 infection and should not be used as the sole basis for treatment or other patient management decisions. A negative result may occur with  improper specimen collection/handling, submission of specimen other than nasopharyngeal swab, presence of viral mutation(s) within the areas targeted by this assay, and inadequate number of viral copies(<138 copies/mL). A negative result must be combined with clinical observations, patient history, and epidemiological information. The expected result is Negative.  Fact Sheet for Patients:  bloggercourse.com  Fact Sheet for Healthcare Providers:  seriousbroker.it  This test is no t yet approved or cleared by the [place]  FDA and  has been authorized for detection and/or diagnosis of SARS-CoV-2 by FDA under an Emergency Use Authorization (EUA). This EUA will remain  in effect (meaning this test can be used) for the duration of the COVID-19 declaration under Section 564(b)(1) of the Act, 21 U.S.C.section 360bbb-3(b)(1), unless the authorization is terminated  or revoked sooner.       Influenza A by PCR NEGATIVE NEGATIVE Final   Influenza B by PCR NEGATIVE NEGATIVE Final    Comment: (NOTE) The Xpert Xpress SARS-CoV-2/FLU/RSV plus assay is intended as an aid in the diagnosis of influenza from Nasopharyngeal swab specimens and should not be used as a sole basis for treatment. Nasal washings and aspirates are unacceptable for Xpert Xpress SARS-CoV-2/FLU/RSV testing.  Fact Sheet for Patients: bloggercourse.com  Fact Sheet for Healthcare Providers: seriousbroker.it  This test is not yet approved or cleared by the [place]  FDA and has been authorized for detection and/or diagnosis of SARS-CoV-2 by FDA under an Emergency Use Authorization (EUA). This EUA will remain in effect (meaning this test can be used) for the duration of  the COVID-19 declaration under Section 564(b)(1) of the Act, 21 U.S.C. section 360bbb-3(b)(1), unless the authorization is terminated or revoked.     Resp Syncytial Virus by PCR NEGATIVE NEGATIVE Final    Comment: (NOTE) Fact Sheet for Patients: bloggercourse.com  Fact Sheet for Healthcare Providers: seriousbroker.it  This test is not yet approved or cleared by the [place]  FDA and has been authorized for detection and/or diagnosis of SARS-CoV-2 by FDA under an Emergency Use Authorization (EUA). This EUA will remain in effect (meaning this test can be used) for the duration of the COVID-19 declaration under Section 564(b)(1) of the Act, 21 U.S.C. section 360bbb-3(b)(1),  unless the authorization is terminated or revoked.  Performed at [facility], [address] [place], [place] [address]      Radiological Exams on Admission:   Assessment/Plan Principal Problem:   Acute respiratory failure with hypoxia [facility]) Active Problems:   Myocardial injury   Hypotension   Elevated lactic acid level   Hypertension   Sinus tachycardia   Hypokalemia   PAD (peripheral artery disease)   Morbid obesity (HCC)   Assessment and Plan:   Acute respiratory failure with hypoxia [facility]): Patient has 4L new oxygen requirement with moderate acute respiratory distress.  BNP<50, no pulm edema on chest x-ray, less likely to have acute CHF.  No fever or leukocytosis, clinically no pneumonia.  Patient has negative PCR for COVID, flu and RSV.  Differential diagnosis include OSA/hypoventilation syndrome and PE.  Patient has morbid obesity, limiting his mobility, also has tachycardia and lightheadedness, will need to r/o PE.  - Placed in PCU for observation - As needed albuterol  and Mucinex  - Continue nasal cannula oxygen to maintain oxygen saturation above 93% - Check RVP - Check D-dimer.  If D-dimer is positive, will start IV heparin   empirically.  Myocardial injury: trop is minimally elevated 23 --> 27.  No active chest pain.  Likely demand ischemia. -Patient received 325 mg aspirin  from EMS - Aspirin  81 mg daily tomorrow - Trend troponin - Check A1c, FLP - Check UDS  Hypotension: possibly due to nitroglycerin paste use, which was removed already.  Blood pressure improved to 77/55 to 109/54 after giving 1 L LR. -NS at 75 cc/h for 8 hours -Hold blood pressure medications  Elevated lactic acid level: Lactic acid 4.6.  No fever or leukocytosis.  Clinically not septic.  Likely due to hypoxia. - Trend lactic acid level - IV fluids as above  Hypertension: - Hold Lotrel and torsemide - IV hydralazine  as needed  Sinus tachycardia: Per EMS report, pt had A fib with HR 110-200s, but no strips were available from EMS. 3 EKGs were done in ED, all showed sinus tachycardia with PAC.  Current heart rate in 90s.  TSH normal 3.30, T4 normal 1.35 today - Telemetry monitoring - Give 2 g magnesium  sulfate (magnesium  level 1.9) to maintain magnesium  level above 2  Hypokalemia: Potassium 3.3.  Magnesium  1.9. - Check phosphorus level  Morbid obesity (HCC): patient has Obesity Class III, with body weight 333.4 Kg and BMI  kg/m2.  - Encourage losing weight - Exercise and healthy diet - Patient does not have PCP. He usually seen in telemedicine for prescription.  I highly recommend patient to establish care with a primary provider, and do a formal sleep study for possibility of OSA/hypoventilation syndrome.  I also highly recommended patient to start Ozempic or similar medicine for losing weight. -consult TOC for PCP need             DVT ppx: SQ Lovenox   Code Status: Full code   Family Communication:   Yes, patient's sister at bed side.       Disposition Plan:  Anticipate discharge back to previous environment  Consults called:  none  Admission status and Level of care: Progressive:    for obs     Dispo: The  patient is from: Home              Anticipated d/c is to: Home              Anticipated d/c date is: 1 day  Patient currently is not medically stable to d/c.    Severity of Illness:  The appropriate patient status for this patient is OBSERVATION. Observation status is judged to be reasonable and necessary in order to provide the required intensity of service to ensure the patient's safety. The patient's presenting symptoms, physical exam findings, and initial radiographic and laboratory data in the context of their medical condition is felt to place them at decreased risk for further clinical deterioration. Furthermore, it is anticipated that the patient will be medically stable for discharge from the hospital within 2 midnights of admission.        Date of Service 12/26/2024    Caleb Exon [facility]   If 7PM-7AM, please contact night-coverage [email or web address] 12/26/2024, 8:13 PM     [1]  Allergies Allergen Reactions   Sulfa Antibiotics Rash   "

## 2024-12-26 NOTE — ED Provider Notes (Signed)
 "  [facility] Provider Note    None    (approximate)   History   Chest Pain   HPI  Bryan Sloan is a 54 y.o. male with super morbid obesity, hypertension who presents from home with 1 week of progressively worsening shortness of breath and palpitations.  He has intermittently had chest pain as well.  Patient has not left his household in over 4 years and does not see a primary care physician regularly secondary to his size.  He called EMS today for shortness of breath and palpitations and an episode of chest pain.  EMS was concerned that he may be in A-fib upon arrival.  They placed Nitropaste and gave him 324 of aspirin .  Patient denies any chest pain on arrival.  Patient does not use oxygen at baseline.  Does endorse some lower extremity edema.     Physical Exam   Triage Vital Signs: ED Triage Vitals [12/26/24 1600]  Encounter Vitals Group     BP 131/68     Girls Systolic BP Percentile      Girls Diastolic BP Percentile      Boys Systolic BP Percentile      Boys Diastolic BP Percentile      Pulse Rate (!) 114     Resp 18     Temp      Temp src      SpO2 100 %     Weight      Height      Head Circumference      Peak Flow      Pain Score      Pain Loc      Pain Education      Exclude from Growth Chart     Most recent vital signs: Vitals:   12/26/24 1700 12/26/24 1730  BP: (!) 77/55 (!) 109/54  Pulse: 99 (!) 108  Resp: 15 18  Temp:    SpO2: 94% 91%    Nursing Triage Note reviewed. Vital signs reviewed and patients oxygen saturation is hypoxic   General: Patient is well nourished, well developed, awake and alert, appears uncomfortable Head: Normocephalic and atraumatic Eyes: Normal inspection, extraocular muscles intact, no conjunctival pallor Ear, nose, throat: Normal external exam Neck: Normal range of motion Respiratory: Patient is in no respiratory distress, lungs distant Cardiovascular: Patient is tachycardic, RR  GI: Abd SNT  with no guarding or rebound   Extremities: pulses intact with good cap refills, 2+ LE pitting edema no calf tenderness Neuro: The patient is alert and oriented to person, place, and time, appropriately conversive, with 5/5 bilat UE/LE strength, no gross motor or sensory defects noted. Coordination appears to be adequate. Skin: Warm, dry, and intact Psych: anxous mood and affect, no SI or HI  ED Results / Procedures / Treatments   Labs (all labs ordered are listed, but only abnormal results are displayed) Labs Reviewed  COMPREHENSIVE METABOLIC PANEL WITH GFR - Abnormal; Notable for the following components:      Result Value   Sodium 132 (*)    Potassium 3.3 (*)    Chloride 90 (*)    Glucose, Bld 162 (*)    Total Protein 8.7 (*)    Anion gap 18 (*)    All other components within normal limits  LACTIC ACID, PLASMA - Abnormal; Notable for the following components:   Lactic Acid, Venous 4.6 (*)    All other components within normal limits  BLOOD GAS, VENOUS -  Abnormal; Notable for the following components:   Bicarbonate 28.5 (*)    Acid-Base Excess 3.2 (*)    All other components within normal limits  TROPONIN T, HIGH SENSITIVITY - Abnormal; Notable for the following components:   Troponin T High Sensitivity 23 (*)    All other components within normal limits  TROPONIN T, HIGH SENSITIVITY - Abnormal; Notable for the following components:   Troponin T High Sensitivity 27 (*)    All other components within normal limits  RESP PANEL BY RT-PCR (RSV, FLU A&B, COVID)  [id number]  CULTURE, BLOOD (ROUTINE X 2)  CULTURE, BLOOD (ROUTINE X 2)  RESPIRATORY PANEL BY PCR  CBC WITH DIFFERENTIAL/PLATELET  LIPASE, BLOOD  PRO BRAIN NATRIURETIC PEPTIDE  MAGNESIUM   TSH  T4, FREE  APTT  PROTIME-INR  URINALYSIS, COMPLETE (UACMP) WITH MICROSCOPIC  PHOSPHORUS  LACTIC ACID, PLASMA  LACTIC ACID, PLASMA  URINE DRUG SCREEN     EKG EKG and rhythm strip are interpreted by myself:   EKG:  tachycardic sinus rhythm] at heart rate of 106, normal QRS duration, QTc 411, normal ST segments and T waves no ectopy EKG not consistent with Acute STEMI Rhythm strip: tachycardic in lead II  EKG and rhythm strip are interpreted by myself:   EKG: tachcyardic sinus rhythm] at heart rate of 107, normal QRS duration, QTc 450, nonspecific ST segments and T waves no ectopy EKG not consistent with Acute STEMI Rhythm strip: NSR in lead II   RADIOLOGY CXR: Nondiagnostic given size on my independent review interpretation by radiologist with this as cardiomegaly without disease Doppler ultrasound: No acute abnormality on my independent review interpretation radiologist agrees    PROCEDURES:  Critical Care performed: No  Procedures   MEDICATIONS ORDERED IN ED: Medications  magnesium  sulfate IVPB 2 g 50 mL (has no administration in time range)  albuterol  (PROVENTIL ) (2.5 MG/3ML) 0.083% nebulizer solution 2.5 mg (has no administration in time range)  dextromethorphan-guaiFENesin  (MUCINEX  DM) 30-600 MG per 12 hr tablet 1 tablet (has no administration in time range)  ondansetron  (ZOFRAN ) injection 4 mg (has no administration in time range)  acetaminophen  (TYLENOL ) tablet 650 mg (has no administration in time range)  hydrALAZINE  (APRESOLINE ) injection 5 mg (has no administration in time range)  oxyCODONE -acetaminophen  (PERCOCET/ROXICET) 5-325 MG per tablet 1 tablet (has no administration in time range)  morphine  (PF) 4 MG/ML injection 4 mg (has no administration in time range)  LORazepam  (ATIVAN ) tablet 1 mg (1 mg Oral Given 12/26/24 1713)  lactated ringers  bolus 1,000 mL (1,000 mLs Intravenous New Bag/Given 12/26/24 1719)  potassium chloride  SA (KLOR-CON  M) CR tablet 40 mEq (40 mEq Oral Given 12/26/24 1840)     IMPRESSION / MDM / ASSESSMENT AND PLAN / ED COURSE                                Differential diagnosis includes, but is not limited to: Pneumonia, URI, arrhythmia, CHF,  electrolyte derangement, anemia, PE   This is a 54 year old male with super morbid obesity hypertension last to primary care physician follow-up presenting with 1 week of worsening shortness of breath palpitations and intermittent chest pain found to be hypoxic.  He arrives requiring 4 L of oxygen.  This was weaned and he was trialed off of it after being in but did desat to 86% on room air and is currently requiring 1 to 2 L.  Chest x-ray nondiagnostic but he has no elevated  inflammatory markers except for a lactic acid which may be secondary to his size.  EKG on 2 occasions with sinus tachycardia.  COVID and influenza swabs are unremarkable.  His proBNP is less than 50.  Troponin x 2 was not significantly elevated.  Blood gas demonstrated no hypercarbia.  Largely suspect that his hypoxia is secondary to possible poor ventilation.  I did consider PE however he has no DVT on Doppler ultrasounds and unfortunately or CT scanner would not hold his weight.  Will hold off on Lovenox  at this time.  Case discussed with hospitalist for admission and likely need to set the patient up with oxygen for home use    Clinical Course as of 12/26/24 1902  Sat Dec 26, 2024  1647 Lactate is elevated but he does not have a leukocytosis is afebrile and chest x-ray looks more consistent with edema.  I am hesitant to give him IV fluids as he looks fluid overloaded at this time.  Will hold off for more information including his BNP and Pro-Cal [HD]  1708 Pro Brain Natriuretic Peptide: <50.0 Not elevated we will give him a liter of fluid [HD]  1746 Resp panel by RT-PCR (RSV, Flu A&B, Covid) Anterior Nasal Swab Negative [HD]  1829 Discussed with hospitalist for admission [HD]    Clinical Course User Index [HD] Nicholaus Rolland BRAVO, MD   -- Risk: 5 This patient has a high risk of morbidity due to further diagnostic testing or treatment. Rationale: This patients evaluation and management involve a high risk of morbidity due  to the potential severity of presenting symptoms, need for diagnostic testing, and/or initiation of treatment that may require close monitoring. The differential includes conditions with potential for significant deterioration or requiring escalation of care. Treatment decisions in the ED, including medication administration, procedural interventions, or disposition planning, reflect this level of risk. COPA: 5 The patient has the following acute or chronic illness/injury that poses a possible threat to life or bodily function: [X] : The patient has a potentially serious acute condition or an acute exacerbation of a chronic illness requiring urgent evaluation and management in the Emergency Department. The clinical presentation necessitates immediate consideration of life-threatening or function-threatening diagnoses, even if they are ultimately ruled out.   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   Nicholaus Rolland BRAVO, MD 12/26/24 1902  "

## 2024-12-27 DIAGNOSIS — E871 Hypo-osmolality and hyponatremia: Secondary | ICD-10-CM | POA: Diagnosis not present

## 2024-12-27 DIAGNOSIS — J9601 Acute respiratory failure with hypoxia: Secondary | ICD-10-CM | POA: Diagnosis not present

## 2024-12-27 DIAGNOSIS — I10 Essential (primary) hypertension: Secondary | ICD-10-CM | POA: Diagnosis not present

## 2024-12-27 DIAGNOSIS — I959 Hypotension, unspecified: Secondary | ICD-10-CM | POA: Diagnosis not present

## 2024-12-27 DIAGNOSIS — E876 Hypokalemia: Secondary | ICD-10-CM | POA: Diagnosis not present

## 2024-12-27 LAB — BLOOD GAS, ARTERIAL
Acid-Base Excess: 2.8 mmol/L — ABNORMAL HIGH (ref 0.0–2.0)
Acid-Base Excess: 1 mmol/L (ref 0.0–2.0)
Bicarbonate: 30.1 mmol/L — ABNORMAL HIGH (ref 20.0–28.0)
Bicarbonate: 25.2 mmol/L (ref 20.0–28.0)
O2 Content: 4 L/min
O2 Saturation: 54.2 %
O2 Saturation: 98.1 %
Patient temperature: 37
Patient temperature: 37
pCO2 arterial: 57 mmHg — ABNORMAL HIGH (ref 32–48)
pCO2 arterial: 38 mmHg (ref 32–48)
pH, Arterial: 7.33 — ABNORMAL LOW (ref 7.35–7.45)
pH, Arterial: 7.43 (ref 7.35–7.45)
pO2, Arterial: 102 mmHg (ref 83–108)

## 2024-12-27 LAB — RESPIRATORY PANEL BY PCR

## 2024-12-27 LAB — CBC
HCT: 41.8 % (ref 39.0–52.0)
Hemoglobin: 13.6 g/dL (ref 13.0–17.0)
MCH: 26.3 pg (ref 26.0–34.0)
MCHC: 32.5 g/dL (ref 30.0–36.0)
MCV: 80.7 fL (ref 80.0–100.0)
Platelets: 196 K/uL (ref 150–400)
RBC: 5.18 MIL/uL (ref 4.22–5.81)
RDW: 13.2 % (ref 11.5–15.5)
WBC: 7 K/uL (ref 4.0–10.5)
nRBC: 0 % (ref 0.0–0.2)

## 2024-12-27 LAB — HEMOGLOBIN A1C
Hgb A1c MFr Bld: 7.7 % — ABNORMAL HIGH (ref 4.8–5.6)
Mean Plasma Glucose: 174.29 mg/dL

## 2024-12-27 LAB — BASIC METABOLIC PANEL WITH GFR
Anion gap: 10 (ref 5–15)
BUN: 21 mg/dL — ABNORMAL HIGH (ref 6–20)
CO2: 27 mmol/L (ref 22–32)
Calcium: 9 mg/dL (ref 8.9–10.3)
Chloride: 92 mmol/L — ABNORMAL LOW (ref 98–111)
Creatinine, Ser: 1.39 mg/dL — ABNORMAL HIGH (ref 0.61–1.24)
GFR, Estimated: >60 mL/min
Glucose, Bld: 124 mg/dL — ABNORMAL HIGH (ref 70–99)
Potassium: 4.2 mmol/L (ref 3.5–5.1)
Sodium: 129 mmol/L — ABNORMAL LOW (ref 135–145)

## 2024-12-27 LAB — LIPID PANEL
Cholesterol: 126 mg/dL (ref 0–200)
HDL: 36 mg/dL — ABNORMAL LOW
LDL Cholesterol: 79 mg/dL (ref 0–99)
Total CHOL/HDL Ratio: 3.6 ratio
Triglycerides: 56 mg/dL
VLDL: 11 mg/dL (ref 0–40)

## 2024-12-27 LAB — URINE DRUG SCREEN
Amphetamines: NEGATIVE
Barbiturates: NEGATIVE
Benzodiazepines: POSITIVE — AB
Cocaine: NEGATIVE
Fentanyl: NEGATIVE
Methadone Scn, Ur: NEGATIVE
Opiates: NEGATIVE
Tetrahydrocannabinol: NEGATIVE

## 2024-12-27 LAB — HEPARIN LEVEL (UNFRACTIONATED)
Heparin Unfractionated: 0.74 [IU]/mL — ABNORMAL HIGH (ref 0.30–0.70)
Heparin Unfractionated: 0.7 [IU]/mL (ref 0.30–0.70)

## 2024-12-27 LAB — HIV ANTIBODY (ROUTINE TESTING W REFLEX): HIV Screen 4th Generation wRfx: NONREACTIVE

## 2024-12-27 LAB — PHOSPHORUS: Phosphorus: 4 mg/dL (ref 2.5–4.6)

## 2024-12-27 MED ORDER — LORAZEPAM 1 MG PO TABS
1.0000 mg | ORAL_TABLET | Freq: Four times a day (QID) | ORAL | Status: DC | PRN
  Administered 2024-12-27: 1 mg via ORAL
  Filled 2024-12-27: qty 1

## 2024-12-27 MED ORDER — IPRATROPIUM-ALBUTEROL 0.5-2.5 (3) MG/3ML IN SOLN
3.0000 mL | Freq: Four times a day (QID) | RESPIRATORY_TRACT | Status: DC
  Administered 2024-12-27 – 2024-12-29 (×9): 3 mL via RESPIRATORY_TRACT
  Filled 2024-12-27 (×10): qty 3

## 2024-12-27 MED ORDER — HEPARIN (PORCINE) 25000 UT/250ML-% IV SOLN
2500.0000 [IU]/h | INTRAVENOUS | Status: DC
  Administered 2024-12-27: 2500 [IU]/h via INTRAVENOUS
  Administered 2024-12-27 (×2): 2700 [IU]/h via INTRAVENOUS
  Administered 2024-12-28: 2500 [IU]/h via INTRAVENOUS
  Filled 2024-12-27 (×6): qty 250

## 2024-12-27 MED ORDER — HEPARIN BOLUS VIA INFUSION
7500.0000 [IU] | Freq: Once | INTRAVENOUS | Status: AC
  Administered 2024-12-27: 7500 [IU] via INTRAVENOUS
  Filled 2024-12-27: qty 7500

## 2024-12-27 MED ORDER — TRAZODONE HCL 50 MG PO TABS
25.0000 mg | ORAL_TABLET | Freq: Every evening | ORAL | Status: DC | PRN
  Administered 2024-12-27 – 2024-12-30 (×4): 25 mg via ORAL
  Filled 2024-12-27 (×4): qty 1

## 2024-12-27 NOTE — Progress Notes (Signed)
 " Progress Note   Patient: Bryan Sloan FMW:[id number] DOB: 1971-12-03 DOA: 12/26/2024     0 DOS: the patient was seen and examined on 12/27/2024   Brief hospital course: SHAMAN MUSCARELLA is a 54 y.o. male with medical history significant of morbid obesity with BMI 96.98 (BW 333.4 KG), HTN, peripheral neuropathy, who presents with SOB.  He had hypoxemia with oxygen saturation 88%, was placed on 4 L oxygen. Patient also was reported to have atrial fibrillation by EMS, but no strips recorded. Patient also had a drop blood pressure down to 77/55, improved after fluids. Due to high risk of PE, patient was placed on heparin  drip.  Not able to perform CT angiogram due to massive body weight.   Principal Problem:   Acute respiratory failure with hypoxia (HCC) Active Problems:   Myocardial injury   Hypotension   Elevated lactic acid level   Hypertension   Sinus tachycardia   Hypokalemia   PAD (peripheral artery disease)   Morbid obesity (HCC)   Assessment and Plan: Acute respiratory failure with hypoxemia. Mild elevation troponin with myocardial injury secondary to demand ischemia. Transient hypotension. Patient chronically has short of breath with minimal exertion, but short of breath much worse for the last 3 to 4 days.  Etiology is still unclear.  Patient does not have acute coronary syndrome, does not have evidence of exacerbation congestive heart failure. Never been diagnosed with COPD, no bronchospasm. High risk for pulmonary emboli, but not able to perform CT scan due to body weight. Heparin  was started.  Probably will transition to Eliquis  when condition more stable as patient has been recorded has atrial fibrillation at the time of admission. I will also add a DuoNeb to her regimen. I will continue to follow closely in the progressive unit.  Possible paroxysmal atrial fibrillation. Reviewed EKG performed this morning, patient is in sinus.  Due to concurrent suspicion of PE, patient  will be treated with anticoagulation chronically.  Class III obesity with BMI 96.97. Patient probably has undiagnosed obstructive sleep apnea, I will obtain ABG to see patient has any CO2 retention.  Severe lactic acidosis. Likely secondary to hypoxia, condition improved.  Hypokalemia. Hyponatremia. Chronic kidney disease stage ii Potassium has normalized, sodium level still low.  Will continue to follow.  Renal function slightly worsened, but GFR still over 60.  Continue to follow.       Subjective:  Patient still complaining some short of breath, much worse after going to bedside commode.  Minimal cough.  Physical Exam: Vitals:   12/27/24 0730 12/27/24 0803 12/27/24 0830 12/27/24 0900  BP: 105/80  114/88 112/78  Pulse: 95  96 99  Resp: (!) 40  16 16  Temp:  (!) 97.4 F (36.3 C)    TempSrc:  Oral    SpO2: 98%  100% 100%  Weight:      Height:       General exam: Appears calm and comfortable, morbidly obese. Respiratory system: Significant decreased breath sounds. Respiratory effort normal. Cardiovascular system: S1 & S2 heard, RRR. No JVD, murmurs, rubs, gallops or clicks. No pedal edema. Gastrointestinal system: Abdomen is nondistended, soft and nontender. No organomegaly or masses felt. Normal bowel sounds heard. Central nervous system: Alert and oriented. No focal neurological deficits. Extremities: Symmetric 5 x 5 power. Skin: No rashes, lesions or ulcers Psychiatry: Judgement and insight appear normal. Mood & affect appropriate.    Data Reviewed:  Reviewed chest x-ray and lab results.  Family Communication: None  Disposition: Status is: Observation      Time spent: 55 minutes  Author: Murvin Mana, MD 12/27/2024 11:49 AM  For on call review [email or web address]    "

## 2024-12-27 NOTE — Progress Notes (Signed)
 ANTICOAGULATION CONSULT NOTE  Pharmacy Consult for heparin  infusion Indication: pulmonary embolus  Allergies[1]  Patient Measurements: Height: 6' 1 (185.4 cm) Weight: (!) 333.4 kg (735 lb) IBW/kg (Calculated) : 79.9 Heparin  Dosing Weight: 170 kg  Vital Signs: Temp: 98 F (36.7 C) (01/10 2326) Temp Source: Oral (01/10 2326) BP: 116/76 (01/11 0030) Pulse Rate: 74 (01/11 0030)  Labs: Recent Labs    12/26/24 1603  HGB 14.4  HCT 44.5  PLT 200  APTT 27  LABPROT 14.0  INR 1.0  CREATININE 1.21    Estimated Creatinine Clearance: 181.1 mL/min (by C-G formula based on SCr of 1.21 mg/dL).   Medical History: Past Medical History:  Diagnosis Date   Hypertension    Morbid obesity (HCC)    Peripheral neuropathy     Assessment: Pt is a 54 yo male presenting to ED with 1 week of progressively worsening shortness of breath and palpitations, being started on heparin  for possible PE.  Pt given enoxaparin  120 mg x 1 dose on 1/10 @ 2112.  Goal of Therapy:  Heparin  level 0.3-0.7 units/ml Monitor platelets by anticoagulation protocol: Yes   Plan:  Bolus 7500 units x 1 Start heparin  infusion at 2700 units/hr Will check HL in 6 hr after start of infusion CBC daily while on heparin   Rankin CANDIE Dills, PharmD, [facility] 12/27/2024 1:07 AM      [1]  Allergies Allergen Reactions   Sulfa Antibiotics Rash

## 2024-12-27 NOTE — Hospital Course (Signed)
 Bryan Sloan is a 54 y.o. male with medical history significant of morbid obesity with BMI 96.98 (BW 333.4 KG), HTN, peripheral neuropathy, who presents with SOB.  He had hypoxemia with oxygen saturation 88%, was placed on 4 L oxygen. Patient also was reported to have atrial fibrillation by EMS, but no strips recorded. Patient also had a drop blood pressure down to 77/55, improved after fluids. Due to high risk of PE, patient was placed on heparin  drip.  Not able to perform CT angiogram due to massive body weight.

## 2024-12-27 NOTE — Progress Notes (Signed)
 ANTICOAGULATION CONSULT NOTE  Pharmacy Consult for heparin  infusion Indication: pulmonary embolus  Allergies[1]  Patient Measurements: Height: 6' 1 (185.4 cm) Weight: (!) 333.4 kg (735 lb) IBW/kg (Calculated) : 79.9 Heparin  Dosing Weight: 170 kg  Vital Signs: Temp: 97.4 F (36.3 C) (01/11 0803) Temp Source: Oral (01/11 0803) BP: 100/62 (01/11 0645) Pulse Rate: 90 (01/11 0645)  Labs: Recent Labs    12/26/24 1603 12/27/24 0505 12/27/24 0718  HGB 14.4 13.6  --   HCT 44.5 41.8  --   PLT 200 196  --   APTT 27  --   --   LABPROT 14.0  --   --   INR 1.0  --   --   HEPARINUNFRC  --   --  0.74*  CREATININE 1.21 1.39*  --     Estimated Creatinine Clearance: 157.6 mL/min (A) (by C-G formula based on SCr of 1.39 mg/dL (H)).   Medical History: Past Medical History:  Diagnosis Date   Hypertension    Morbid obesity (HCC)    Peripheral neuropathy     Assessment: Pt is a 54 yo male presenting to ED with 1 week of progressively worsening shortness of breath and palpitations, being started on heparin  for possible PE.  Pt given enoxaparin  120 mg x 1 dose on 1/10 @ 2112.  Goal of Therapy:  Heparin  level 0.3-0.7 units/ml Monitor platelets by anticoagulation protocol: Yes  1/11 0718 HL 0.74 supratherapeutic   Plan:  1/11 0718 HL 0.74   supratherapeutic Decrease heparin  infusion to 2500 units/hr Will check HL in 6 hr after rate change CBC daily while on heparin   Allean Haas PharmD Clinical Pharmacist 12/27/2024        [1]  Allergies Allergen Reactions   Sulfa Antibiotics Rash

## 2024-12-27 NOTE — ED Notes (Signed)
 Answered call light, pt was successful in being able to use the bathroom with using the walker with assistance. Connected pt back up to the monitor, no further needs at this time.

## 2024-12-27 NOTE — ED Notes (Signed)
 Answered call light, pt ask for water, provided

## 2024-12-27 NOTE — Progress Notes (Signed)
 ANTICOAGULATION CONSULT NOTE  Pharmacy Consult for heparin  infusion Indication: pulmonary embolus  Allergies[1]  Patient Measurements: Height: 6' 1 (185.4 cm) Weight: (!) 333.4 kg (735 lb) IBW/kg (Calculated) : 79.9 HEPARIN  DW (KG): 169.9 Heparin  Dosing Weight: 170 kg  Vital Signs: Temp: 97.6 F (36.4 C) (01/11 1328) Temp Source: Oral (01/11 1328) BP: 119/67 (01/11 1400) Pulse Rate: 109 (01/11 1400)  Labs: Recent Labs    12/26/24 1603 12/27/24 0505 12/27/24 0718 12/27/24 1708  HGB 14.4 13.6  --   --   HCT 44.5 41.8  --   --   PLT 200 196  --   --   APTT 27  --   --   --   LABPROT 14.0  --   --   --   INR 1.0  --   --   --   HEPARINUNFRC  --   --  0.74* 0.70  CREATININE 1.21 1.39*  --   --     Estimated Creatinine Clearance: 157.6 mL/min (A) (by C-G formula based on SCr of 1.39 mg/dL (H)).   Medical History: Past Medical History:  Diagnosis Date   Hypertension    Morbid obesity (HCC)    Peripheral neuropathy     Assessment: Pt is a 54 yo male presenting to ED with 1 week of progressively worsening shortness of breath and palpitations, being started on heparin  for possible PE.  Pt given enoxaparin  120 mg x 1 dose on 1/10 @ 2112.  Goal of Therapy:  Heparin  level 0.3-0.7 units/ml Monitor platelets by anticoagulation protocol: Yes  1/11 0718 HL 0.74 supratherapeutic 1/11 1708 HL 0.70 therapeutic x1   Plan:  HL is therapeutic Continue heparin  infusion at 2500 units/hr Will check HL in 6 hr then daily once x2 consecutive therapeutic levels  CBC daily while on heparin   Annabella LOISE Banks, PharmD Clinical Pharmacist 12/27/2024 5:37 PM      [1]  Allergies Allergen Reactions   Sulfa Antibiotics Rash

## 2024-12-27 NOTE — ED Notes (Signed)
 PT educated multiple times of need for urine specimen. Ambulated to toilet multiple times and refuses urine sample.

## 2024-12-27 NOTE — TOC Initial Note (Signed)
 Transition of Care [facility]) - Initial/Assessment Note    Patient Details  Name: Bryan Sloan MRN: [id number] Date of Birth: 1971/04/13  [facility] CM/SW Contact:    Takyia Sindt L Braydyn Schultes, LCSW Phone Number: 12/27/2024, 8:33 AM  Clinical Narrative:                  [facility] consult received for PCP resources. PCP resources added to the AVS.        Patient Goals and CMS Choice            Expected Discharge Plan and Services                                              Prior Living Arrangements/Services                       Activities of Daily Living      Permission Sought/Granted                  Emotional Assessment              Admission diagnosis:  Acute respiratory failure with hypoxia (HCC) [J96.01] Patient Active Problem List   Diagnosis Date Noted   Acute respiratory failure with hypoxia (HCC) 12/26/2024   Hypotension 12/26/2024   Sinus tachycardia 12/26/2024   Myocardial injury 12/26/2024   Hypokalemia 12/26/2024   Elevated lactic acid level 12/26/2024   Morbid obesity (HCC)    Hypertension    PAD (peripheral artery disease)    PCP:  Patient, No Pcp Per Pharmacy:   [id number] [place], [place] - [address] AT [facility] OF [address] [place] [address] [place] [address] Phone: [phone] Fax: [phone]  [id number] - [place], [place] - [address] [place] [address] Phone: [phone] Fax: [phone]     Social Drivers of Health (SDOH) Social History: SDOH Screenings   Tobacco Use: Low Risk (12/26/2024)   SDOH Interventions:     Readmission Risk Interventions     No data to display

## 2024-12-27 NOTE — ED Notes (Signed)
 Pt requested medication to help him sleep. RN reached out to provider.

## 2024-12-27 NOTE — ED Notes (Signed)
 Answered call light, pt ask how is he supposed to use the bathroom, advised pt to use the urinal ask pt if he need to have a BM, pt advised he was not sure. PT states at home he is able to ambulate to use the bathroom, disconnected pt from the monitor and gave  him a walker so he is able to walk to the toilet in his room. Instructed pt to ring the call bell once finished.

## 2024-12-27 NOTE — Progress Notes (Signed)
" ° °  Brief Progress Note   _____________________________________________________________________________________________________________  Patient Name: Bryan Sloan Patient DOB: July 24, 1971 Date: @TODAY @      Data: Reviewed labs, notes, VS.    Action: No action needed at this time.     Response:    _____________________________________________________________________________________________________________  [facility] RN Expeditor [name] Please contact us  directly via secure chat (search for [facility]) or by calling us  at [phone] [facility]).  "

## 2024-12-27 NOTE — ED Notes (Signed)
 Notified MD pt is feeling 4/10 tired feeling on chest.

## 2024-12-28 DIAGNOSIS — Z23 Encounter for immunization: Secondary | ICD-10-CM | POA: Diagnosis present

## 2024-12-28 DIAGNOSIS — E871 Hypo-osmolality and hyponatremia: Secondary | ICD-10-CM

## 2024-12-28 DIAGNOSIS — I739 Peripheral vascular disease, unspecified: Secondary | ICD-10-CM | POA: Diagnosis present

## 2024-12-28 DIAGNOSIS — G629 Polyneuropathy, unspecified: Secondary | ICD-10-CM | POA: Diagnosis present

## 2024-12-28 DIAGNOSIS — J9601 Acute respiratory failure with hypoxia: Secondary | ICD-10-CM | POA: Diagnosis present

## 2024-12-28 DIAGNOSIS — Z79899 Other long term (current) drug therapy: Secondary | ICD-10-CM | POA: Diagnosis not present

## 2024-12-28 DIAGNOSIS — Z713 Dietary counseling and surveillance: Secondary | ICD-10-CM | POA: Diagnosis not present

## 2024-12-28 DIAGNOSIS — Z791 Long term (current) use of non-steroidal anti-inflammatories (NSAID): Secondary | ICD-10-CM | POA: Diagnosis not present

## 2024-12-28 DIAGNOSIS — Z833 Family history of diabetes mellitus: Secondary | ICD-10-CM | POA: Diagnosis not present

## 2024-12-28 DIAGNOSIS — E872 Acidosis, unspecified: Secondary | ICD-10-CM | POA: Diagnosis present

## 2024-12-28 DIAGNOSIS — Z1152 Encounter for screening for COVID-19: Secondary | ICD-10-CM | POA: Diagnosis not present

## 2024-12-28 DIAGNOSIS — I129 Hypertensive chronic kidney disease with stage 1 through stage 4 chronic kidney disease, or unspecified chronic kidney disease: Secondary | ICD-10-CM | POA: Diagnosis present

## 2024-12-28 DIAGNOSIS — I10 Essential (primary) hypertension: Secondary | ICD-10-CM | POA: Diagnosis not present

## 2024-12-28 DIAGNOSIS — R0902 Hypoxemia: Secondary | ICD-10-CM | POA: Diagnosis present

## 2024-12-28 DIAGNOSIS — Z7982 Long term (current) use of aspirin: Secondary | ICD-10-CM | POA: Diagnosis not present

## 2024-12-28 DIAGNOSIS — I480 Paroxysmal atrial fibrillation: Secondary | ICD-10-CM | POA: Diagnosis present

## 2024-12-28 DIAGNOSIS — Z6841 Body Mass Index (BMI) 40.0 and over, adult: Secondary | ICD-10-CM | POA: Diagnosis not present

## 2024-12-28 DIAGNOSIS — N182 Chronic kidney disease, stage 2 (mild): Secondary | ICD-10-CM | POA: Diagnosis present

## 2024-12-28 DIAGNOSIS — I2489 Other forms of acute ischemic heart disease: Secondary | ICD-10-CM | POA: Diagnosis present

## 2024-12-28 DIAGNOSIS — Z8249 Family history of ischemic heart disease and other diseases of the circulatory system: Secondary | ICD-10-CM | POA: Diagnosis not present

## 2024-12-28 DIAGNOSIS — E662 Morbid (severe) obesity with alveolar hypoventilation: Secondary | ICD-10-CM | POA: Diagnosis present

## 2024-12-28 DIAGNOSIS — I959 Hypotension, unspecified: Secondary | ICD-10-CM | POA: Diagnosis present

## 2024-12-28 DIAGNOSIS — E876 Hypokalemia: Secondary | ICD-10-CM | POA: Diagnosis present

## 2024-12-28 LAB — BASIC METABOLIC PANEL WITH GFR
Anion gap: 14 (ref 5–15)
BUN: 27 mg/dL — ABNORMAL HIGH (ref 6–20)
CO2: 23 mmol/L (ref 22–32)
Calcium: 9.2 mg/dL (ref 8.9–10.3)
Chloride: 90 mmol/L — ABNORMAL LOW (ref 98–111)
Creatinine, Ser: 1.36 mg/dL — ABNORMAL HIGH (ref 0.61–1.24)
GFR, Estimated: >60 mL/min
Glucose, Bld: 137 mg/dL — ABNORMAL HIGH (ref 70–99)
Potassium: 3.9 mmol/L (ref 3.5–5.1)
Sodium: 127 mmol/L — ABNORMAL LOW (ref 135–145)

## 2024-12-28 LAB — CBC
HCT: 41.7 % (ref 39.0–52.0)
Hemoglobin: 13.7 g/dL (ref 13.0–17.0)
MCH: 26.1 pg (ref 26.0–34.0)
MCHC: 32.9 g/dL (ref 30.0–36.0)
MCV: 79.6 fL — ABNORMAL LOW (ref 80.0–100.0)
Platelets: 191 K/uL (ref 150–400)
RBC: 5.24 MIL/uL (ref 4.22–5.81)
RDW: 13.2 % (ref 11.5–15.5)
WBC: 7.7 K/uL (ref 4.0–10.5)
nRBC: 0 % (ref 0.0–0.2)

## 2024-12-28 LAB — HEPARIN LEVEL (UNFRACTIONATED)
Heparin Unfractionated: 0.37 [IU]/mL (ref 0.30–0.70)
Heparin Unfractionated: 0.48 [IU]/mL (ref 0.30–0.70)

## 2024-12-28 LAB — MAGNESIUM: Magnesium: 1.8 mg/dL (ref 1.7–2.4)

## 2024-12-28 LAB — C DIFFICILE QUICK SCREEN W PCR REFLEX
C Diff antigen: NEGATIVE
C Diff interpretation: NOT DETECTED
C Diff toxin: NEGATIVE

## 2024-12-28 MED ORDER — SODIUM CHLORIDE 1 G PO TABS
1.0000 g | ORAL_TABLET | Freq: Two times a day (BID) | ORAL | Status: DC
  Administered 2024-12-28 – 2024-12-31 (×7): 1 g via ORAL
  Filled 2024-12-28 (×7): qty 1

## 2024-12-28 MED ORDER — PANCRELIPASE (LIP-PROT-AMYL) 12000-38000 UNITS PO CPEP
24000.0000 [IU] | ORAL_CAPSULE | Freq: Three times a day (TID) | ORAL | Status: DC
  Administered 2024-12-28 – 2024-12-31 (×8): 24000 [IU] via ORAL
  Filled 2024-12-28 (×12): qty 2

## 2024-12-28 NOTE — ED Notes (Signed)
 Pt removed from cardiac monitor, and ambulatory to bathroom. Pt wheeled self over in recliner chair, used railing to stand and sat on toilet in room. Pt assisted with pericare,and able to use walker to sit back in recliner chair. Pt hooked pack up to cardiac monitoring.

## 2024-12-28 NOTE — ED Notes (Signed)
 Pt complains of diarrhea, and is requesting an anti-diarrhea medicine. MD Zhang notified via secure chat.

## 2024-12-28 NOTE — Progress Notes (Signed)
 " Progress Note   Patient: Bryan Sloan FMW:[id number] DOB: 1971/05/13 DOA: 12/26/2024     0 DOS: the patient was seen and examined on 12/28/2024   Brief hospital course: Bryan Sloan is a 54 y.o. male with medical history significant of morbid obesity with BMI 96.98 (BW 333.4 KG), HTN, peripheral neuropathy, who presents with SOB.  He had hypoxemia with oxygen saturation 88%, was placed on 4 L oxygen. Patient also was reported to have atrial fibrillation by EMS, but no strips recorded. Patient also had a drop blood pressure down to 77/55, improved after fluids. Due to high risk of PE, patient was placed on heparin  drip.  Not able to perform CT angiogram due to massive body weight.   Principal Problem:   Acute respiratory failure with hypoxia (HCC) Active Problems:   Myocardial injury   Hypotension   Elevated lactic acid level   Hypertension   Sinus tachycardia   Hypokalemia   PAD (peripheral artery disease)   Morbid obesity (HCC)   Assessment and Plan: Acute respiratory failure with hypoxemia. Mild elevation troponin with myocardial injury secondary to demand ischemia. Transient hypotension. Patient chronically has short of breath with minimal exertion, but short of breath much worse for the last 3 to 4 days.  Etiology is still unclear.  Patient does not have acute coronary syndrome, does not have evidence of exacerbation congestive heart failure. Never been diagnosed with COPD, no bronchospasm. High risk for pulmonary emboli, but not able to perform CT scan due to body weight.  VQ scan was ordered, still cannot be performed due to body weight. Heparin  was started.  Probably will transition to Eliquis  when condition more stable as patient has been recorded has atrial fibrillation at the time of admission. I will also add a DuoNeb to her regimen. Attempt to remove oxygen resulted in short of breath, oxygen saturation was 91% now on room air.  Put back on 2 L.   Possible paroxysmal  atrial fibrillation. Reviewed EKG performed this morning, patient is in sinus.  Due to concurrent suspicion of PE, patient will be treated with anticoagulation chronically.   Class III obesity with BMI 96.97. Patient probably has undiagnosed obstructive sleep apnea, but ABG did not show any CO2 retention.  Patient can get outpatient sleep study which will be scheduled by PCP.   Severe lactic acidosis. Likely secondary to hypoxia, condition improved.   Hypokalemia. Hyponatremia. Chronic kidney disease stage ii Diarrhea. Patient started have diarrhea today, had watery stool in the morning.  Will obtain C. difficile and GI panel. Sodium level dropped to 127, will start salt tablets.  Patient does not appear to be dehydrated, will also start a fluid restriction.             Subjective:  Patient started have diarrhea today which was watery, no abdominal pain or cramping.  No nausea.  Physical Exam: Vitals:   12/28/24 0700 12/28/24 0738 12/28/24 0945 12/28/24 0952  BP: 128/79 128/79    Pulse:  (!) 110 (!) 108   Resp: 11 16 17    Temp:  98 F (36.7 C)    TempSrc:  Oral    SpO2:  95% 91% (S) 96%  Weight:      Height:       General exam: Appears calm and comfortable, morbidly obese. Respiratory system: Decreased breath sounds. Respiratory effort normal. Cardiovascular system: S1 & S2 heard, RRR. No JVD, murmurs, rubs, gallops or clicks. No pedal edema. Gastrointestinal system: Abdomen is  nondistended, soft and nontender. No organomegaly or masses felt. Normal bowel sounds heard. Central nervous system: Alert and oriented. No focal neurological deficits. Extremities: Symmetric 5 x 5 power. Skin: No rashes, lesions or ulcers Psychiatry: Judgement and insight appear normal. Mood & affect appropriate.    Data Reviewed:  Lab results reviewed.  Family Communication: None  Disposition: Status is: Observation      Time spent: 35 minutes  Author: Murvin Mana,  MD 12/28/2024 9:58 AM  For on call review [email or web address]    "

## 2024-12-28 NOTE — ED Notes (Signed)
 Pt ambulatory to bathroom in room with walker. Pt denies nee for assistance with ambulation.

## 2024-12-28 NOTE — Progress Notes (Signed)
 ANTICOAGULATION CONSULT NOTE  Pharmacy Consult for heparin  infusion Indication: pulmonary embolus  Allergies[1]  Patient Measurements: Height: 6' 1 (185.4 cm) Weight: (!) 333.4 kg (735 lb) IBW/kg (Calculated) : 79.9 HEPARIN  DW (KG): 169.9 Heparin  Dosing Weight: 170 kg  Vital Signs: Temp: 97.6 F (36.4 C) (01/11 1905) Temp Source: Oral (01/11 1905) BP: 92/62 (01/11 2230) Pulse Rate: 99 (01/11 2230)  Labs: Recent Labs    12/26/24 1603 12/27/24 0505 12/27/24 0718 12/27/24 1708 12/27/24 2338  HGB 14.4 13.6  --   --   --   HCT 44.5 41.8  --   --   --   PLT 200 196  --   --   --   APTT 27  --   --   --   --   LABPROT 14.0  --   --   --   --   INR 1.0  --   --   --   --   HEPARINUNFRC  --   --  0.74* 0.70 0.37  CREATININE 1.21 1.39*  --   --   --     Estimated Creatinine Clearance: 157.6 mL/min (A) (by C-G formula based on SCr of 1.39 mg/dL (H)).   Medical History: Past Medical History:  Diagnosis Date   Hypertension    Morbid obesity (HCC)    Peripheral neuropathy     Assessment: Pt is a 54 yo male presenting to ED with 1 week of progressively worsening shortness of breath and palpitations, being started on heparin  for possible PE.  Pt given enoxaparin  120 mg x 1 dose on 1/10 @ 2112.  Goal of Therapy:  Heparin  level 0.3-0.7 units/ml Monitor platelets by anticoagulation protocol: Yes  1/11 0718 HL 0.74 supratherapeutic 1/11 1708 HL 0.70 therapeutic x1 1/11 2338 HL 0.37 therapeutic x 2   Plan:  HL is therapeutic Continue heparin  infusion at 2500 units/hr Will check HL daily w/ AM labs while therapeutic CBC daily while on heparin   Rankin CANDIE Dills, PharmD, [facility] 12/28/2024 12:24 AM       [1]  Allergies Allergen Reactions   Sulfa Antibiotics Rash

## 2024-12-28 NOTE — ED Notes (Signed)
 Pt ambulatory to bed from toilet with no assistance. Pt repositioned himself back in bed. Pt placed back on cardiac monitoring.

## 2024-12-28 NOTE — ED Notes (Addendum)
 Helped pt ambulate from recliner to bed. Pt required walker to stand up. Bed locked and in the lowest position, call bell within reach

## 2024-12-28 NOTE — ED Notes (Signed)
 Pt ambulated to in room toilet with supervision and walker, gait unsteady.  Pt tolerated well.

## 2024-12-28 NOTE — ED Notes (Signed)
 Pt ambulated to in room toilet for urination with walker and supervision. Gait unsteady.

## 2024-12-28 NOTE — ED Notes (Signed)
 Pt ambulate from bed to recliner

## 2024-12-28 NOTE — Progress Notes (Signed)
 ANTICOAGULATION CONSULT NOTE  Pharmacy Consult for heparin  infusion Indication: pulmonary embolus  Allergies[1]  Patient Measurements: Height: 6' 1 (185.4 cm) Weight: (!) 333.4 kg (735 lb) IBW/kg (Calculated) : 79.9 HEPARIN  DW (KG): 169.9 Heparin  Dosing Weight: 170 kg  Vital Signs: Temp: 98 F (36.7 C) (01/12 0738) Temp Source: Oral (01/12 0738) BP: 128/79 (01/12 0738) Pulse Rate: 110 (01/12 0738)  Labs: Recent Labs    12/26/24 1603 12/27/24 0505 12/27/24 0718 12/27/24 1708 12/27/24 2338 12/28/24 0500  HGB 14.4 13.6  --   --   --  13.7  HCT 44.5 41.8  --   --   --  41.7  PLT 200 196  --   --   --  191  APTT 27  --   --   --   --   --   LABPROT 14.0  --   --   --   --   --   INR 1.0  --   --   --   --   --   HEPARINUNFRC  --   --    < > 0.70 0.37 0.48  CREATININE 1.21 1.39*  --   --   --  1.36*   < > = values in this interval not displayed.    Estimated Creatinine Clearance: 161.1 mL/min (A) (by C-G formula based on SCr of 1.36 mg/dL (H)).   Medical History: Past Medical History:  Diagnosis Date   Hypertension    Morbid obesity (HCC)    Peripheral neuropathy     Assessment: Pt is a 54 yo male presenting to ED with 1 week of progressively worsening shortness of breath and palpitations, being started on heparin  for possible PE.  Pt given enoxaparin  120 mg x 1 dose on 1/10 @ 2112.  Goal of Therapy:  Heparin  level 0.3-0.7 units/ml Monitor platelets by anticoagulation protocol: Yes  1/11 0718 HL 0.74 supratherapeutic 1/11 1708 HL 0.70 therapeutic x1 1/11 2338 HL 0.37 therapeutic x 2 1/12 0500 HL 0.48 therapeutic   Plan:  Continue heparin  infusion at 2500 units/hr Will check HL daily w/ AM labs while therapeutic CBC daily while on heparin   Will M. Lenon, PharmD, BCPS Clinical Pharmacist 12/28/2024 8:28 AM     [1]  Allergies Allergen Reactions   Sulfa Antibiotics Rash

## 2024-12-28 NOTE — ED Notes (Signed)
 Pt removed from oxygen per provider. This RN called to pt bedside due to shortness of breath. Pt 91% on room air. Pt placed on 2L nasal cannula by this RN. Pt with a saturation of 95% at this time.

## 2024-12-29 ENCOUNTER — Telehealth (HOSPITAL_COMMUNITY): Payer: Self-pay

## 2024-12-29 ENCOUNTER — Other Ambulatory Visit (HOSPITAL_COMMUNITY): Payer: Self-pay

## 2024-12-29 LAB — CBC
HCT: 39.3 % (ref 39.0–52.0)
Hemoglobin: 13 g/dL (ref 13.0–17.0)
MCH: 26.2 pg (ref 26.0–34.0)
MCHC: 33.1 g/dL (ref 30.0–36.0)
MCV: 79.2 fL — ABNORMAL LOW (ref 80.0–100.0)
Platelets: 166 K/uL (ref 150–400)
RBC: 4.96 MIL/uL (ref 4.22–5.81)
RDW: 13.2 % (ref 11.5–15.5)
WBC: 6.8 K/uL (ref 4.0–10.5)
nRBC: 0 % (ref 0.0–0.2)

## 2024-12-29 LAB — BASIC METABOLIC PANEL WITH GFR
Anion gap: 11 (ref 5–15)
BUN: 23 mg/dL — ABNORMAL HIGH (ref 6–20)
CO2: 25 mmol/L (ref 22–32)
Calcium: 9.2 mg/dL (ref 8.9–10.3)
Chloride: 90 mmol/L — ABNORMAL LOW (ref 98–111)
Creatinine, Ser: 1.01 mg/dL (ref 0.61–1.24)
GFR, Estimated: >60 mL/min
Glucose, Bld: 135 mg/dL — ABNORMAL HIGH (ref 70–99)
Potassium: 3.8 mmol/L (ref 3.5–5.1)
Sodium: 127 mmol/L — ABNORMAL LOW (ref 135–145)

## 2024-12-29 LAB — GLUCOSE, CAPILLARY: Glucose-Capillary: 124 mg/dL — ABNORMAL HIGH (ref 70–99)

## 2024-12-29 LAB — MAGNESIUM: Magnesium: 1.9 mg/dL (ref 1.7–2.4)

## 2024-12-29 MED ORDER — IPRATROPIUM-ALBUTEROL 0.5-2.5 (3) MG/3ML IN SOLN: 3.0000 mL | Freq: Four times a day (QID) | RESPIRATORY_TRACT | Status: DC | PRN

## 2024-12-29 MED ORDER — INFLUENZA VIRUS VACC SPLIT PF (FLUZONE) 0.5 ML IM SUSY
0.5000 mL | PREFILLED_SYRINGE | INTRAMUSCULAR | Status: AC
  Administered 2024-12-30: 0.5 mL via INTRAMUSCULAR
  Filled 2024-12-29: qty 0.5

## 2024-12-29 MED ORDER — PNEUMOCOCCAL 20-VAL CONJ VACC 0.5 ML IM SUSY
0.5000 mL | PREFILLED_SYRINGE | INTRAMUSCULAR | Status: AC
  Administered 2024-12-30: 0.5 mL via INTRAMUSCULAR
  Filled 2024-12-29: qty 0.5

## 2024-12-29 MED ORDER — APIXABAN 5 MG PO TABS
10.0000 mg | ORAL_TABLET | Freq: Two times a day (BID) | ORAL | Status: DC
  Administered 2024-12-29 – 2024-12-31 (×5): 10 mg via ORAL
  Filled 2024-12-29 (×5): qty 2

## 2024-12-29 MED ORDER — APIXABAN 5 MG PO TABS: 5.0000 mg | ORAL_TABLET | Freq: Two times a day (BID) | ORAL | Status: DC

## 2024-12-29 NOTE — ED Notes (Signed)
 Requested lab to draw morning labs.  Per [place], she will put on the board.

## 2024-12-29 NOTE — Progress Notes (Signed)
 " Progress Note   Patient: Bryan Sloan FMW:[id number] DOB: [birth year] DOA: 12/26/2024     1 DOS: the patient was seen and examined on 12/29/2024   Brief hospital course: Bryan Sloan is a 54 y.o. male with medical history significant of morbid obesity with BMI 96.98 (BW 333.4 KG), HTN, peripheral neuropathy, who presents with SOB.  He had hypoxemia with oxygen saturation 88%, was placed on 4 L oxygen. Patient also was reported to have atrial fibrillation by EMS, but no strips recorded. Patient also had a drop blood pressure down to 77/55, improved after fluids. Due to high risk of PE, patient was placed on heparin  drip.  Not able to perform CT angiogram due to massive body weight.   Principal Problem:   Acute respiratory failure with hypoxia (HCC) Active Problems:   Myocardial injury   Hypotension   Elevated lactic acid level   Hypertension   Sinus tachycardia   Hypokalemia   PAD (peripheral artery disease)   Morbid obesity (HCC)   Hyponatremia   Acute hypoxic respiratory failure (HCC)   Assessment and Plan: Acute respiratory failure with hypoxemia. Mild elevation troponin with myocardial injury secondary to demand ischemia. Transient hypotension. Patient chronically has short of breath with minimal exertion, but short of breath much worse for the last 3 to 4 days.  Etiology is still unclear.  Patient does not have acute coronary syndrome, does not have evidence of exacerbation congestive heart failure. Never been diagnosed with COPD, no bronchospasm. Patient has a high risk of PE with elevated D-dimer, could not perform CT angiogram or VQ scan due to body weight. Heparin  was started.  I will transition to oral Eliquis .  Due to concern of A-fib, Eliquis  probably should be continued indefinitely. I will also add a DuoNeb to her regimen. Patient is able to take off oxygen today.  However, patient appeared to have a nocturnal hypoxia, home oxygen evaluation did not cause any  hypoxemia.  Will obtain overnight oximetry, patient probably need a nocturnal oxygen.   Possible paroxysmal atrial fibrillation. Reviewed EKG performed this morning, patient is in sinus.  Due to concurrent suspicion of PE, patient will be treated with anticoagulation chronically.   Class III obesity with BMI 96.97. Patient probably has undiagnosed obstructive sleep apnea, but ABG did not show any CO2 retention.  Will perform overnight oximetry for nocturnal oxygen use.  Patient can get outpatient sleep study with PCP.   Severe lactic acidosis. Likely secondary to hypoxia, condition improved.   Hypokalemia. Hyponatremia. Chronic kidney disease stage ii Diarrhea. Patient started have diarrhea today, had watery stool in the morning.  Patient was started on Creon , diarrhea resolved.  Not able to collect stool for study. Still has hyponatremia, added sodium tablets since 1/12, fluid restriction.  Sodium level is stabilizing.       Subjective:  Patient doing better today, off oxygen, less short of breath.  Physical Exam: Vitals:   12/29/24 0422 12/29/24 0907 12/29/24 0925 12/29/24 1041  BP: 126/69 (!) 146/89  124/80  Pulse: (!) 101 (!) 107  (!) 108  Resp: 15 15  20   Temp:  98.4 F (36.9 C)  97.7 F (36.5 C)  TempSrc:    Oral  SpO2: 97% 97% 100% 92%  Weight:      Height:       General exam: Appears calm and comfortable, morbid obese. Respiratory system: Decreased breath sounds. Respiratory effort normal. Cardiovascular system: S1 & S2 heard, RRR. No JVD, murmurs, rubs, gallops  or clicks. No pedal edema. Gastrointestinal system: Abdomen is nondistended, soft and nontender. No organomegaly or masses felt. Normal bowel sounds heard. Central nervous system: Alert and oriented. No focal neurological deficits. Extremities: Symmetric 5 x 5 power. Skin: No rashes, lesions or ulcers Psychiatry: Judgement and insight appear normal. Mood & affect appropriate.    Data Reviewed:  Lab  results reviewed.  Family Communication: None  Disposition: Status is: Inpatient Remains inpatient appropriate because: Severity of disease,     Time spent: 35 minutes  Author: Murvin Mana, MD 12/29/2024 12:18 PM  For on call review [email or web address]    "

## 2024-12-29 NOTE — ED Notes (Signed)
 Assisted pt back to bed from recliner.

## 2024-12-29 NOTE — ED Notes (Signed)
 Pt ambulated to in room toilet for urination with walker and supervision. Gait unsteady.

## 2024-12-29 NOTE — ED Notes (Signed)
 Pt ambulated to in room toilet for urination with supervision and walker.  Gait unsteady.

## 2024-12-29 NOTE — ED Notes (Signed)
 Pt ambulated to in room toilet for urination with walker and supervision.  Returned to recliner to reposition for lower back pain.

## 2024-12-29 NOTE — Telephone Encounter (Signed)
 Pharmacy Patient Advocate Encounter  Insurance verification completed.    The patient is insured through [employer]. Patient has Medicare and is not eligible for a copay card, but may be able to apply for patient assistance or Medicare RX Payment Plan (Patient Must reach out to their plan, if eligible for payment plan), if available.    Ran test claim for Eliquis 5mg  tablet and the current 30 day co-pay is $249 due to deductible.   This test claim was processed through [facility]- copay amounts may vary at other pharmacies due to pharmacy/plan contracts, or as the patient moves through the different stages of their insurance plan.

## 2024-12-30 LAB — GI PATHOGEN PANEL BY PCR, STOOL

## 2024-12-30 LAB — BASIC METABOLIC PANEL WITH GFR
Anion gap: 11 (ref 5–15)
BUN: 20 mg/dL (ref 6–20)
CO2: 26 mmol/L (ref 22–32)
Calcium: 9.4 mg/dL (ref 8.9–10.3)
Chloride: 93 mmol/L — ABNORMAL LOW (ref 98–111)
Creatinine, Ser: 1.06 mg/dL (ref 0.61–1.24)
GFR, Estimated: >60 mL/min
Glucose, Bld: 119 mg/dL — ABNORMAL HIGH (ref 70–99)
Potassium: 4.1 mmol/L (ref 3.5–5.1)
Sodium: 130 mmol/L — ABNORMAL LOW (ref 135–145)

## 2024-12-30 NOTE — Discharge Instructions (Addendum)
 Information on my medicine - ELIQUIS  (apixaban )  This medication education was reviewed with me or my healthcare representative as part of my discharge preparation.  The pharmacist that spoke with me during my hospital stay was:    Why was Eliquis  prescribed for you? Eliquis  was prescribed to treat blood clots that may have been found in the veins of your legs (deep vein thrombosis) or in your lungs (pulmonary embolism) and to reduce the risk of them occurring again.  What do You need to know about Eliquis  ? The starting dose is 10 mg (two 5 mg tablets) taken TWICE daily for the FIRST SEVEN (7) DAYS, then on (enter date)  01/06/2024  the dose is reduced to ONE 5 mg tablet taken TWICE daily.  Eliquis  may be taken with or without food.   Try to take the dose about the same time in the morning and in the evening. If you have difficulty swallowing the tablet whole please discuss with your pharmacist how to take the medication safely.  Take Eliquis  exactly as prescribed and DO NOT stop taking Eliquis  without talking to the doctor who prescribed the medication.  Stopping may increase your risk of developing a new blood clot.  Refill your prescription before you run out.  After discharge, you should have regular check-up appointments with your healthcare provider that is prescribing your Eliquis .    What do you do if you miss a dose? If a dose of ELIQUIS  is not taken at the scheduled time, take it as soon as possible on the same day and twice-daily administration should be resumed. The dose should not be doubled to make up for a missed dose.  Important Safety Information A possible side effect of Eliquis  is bleeding. You should call your healthcare provider right away if you experience any of the following: Bleeding from an injury or your nose that does not stop. Unusual colored urine (red or dark brown) or unusual colored stools (red or black). Unusual bruising for unknown reasons. A  serious fall or if you hit your head (even if there is no bleeding).  Some medicines may interact with Eliquis  and might increase your risk of bleeding or clotting while on Eliquis . To help avoid this, consult your healthcare provider or pharmacist prior to using any new prescription or non-prescription medications, including herbals, vitamins, non-steroidal anti-inflammatory drugs (NSAIDs) and supplements.  This website has more information on Eliquis  (apixaban ): [email or web address] .[email or web address] [email or web address]    Some PCP options in [place] area- not a comprehensive list  [facility]- [phone] [facility]- [phone] [facility]- [phone] [facility]- [phone] [facility]- [phone] Nichole Molly- [phone]  or [facility] Physician Referral Line [phone]

## 2024-12-30 NOTE — Progress Notes (Signed)
 " Progress Note   Patient: Bryan Sloan FMW:[id number] DOB: February 23, 1971 DOA: 12/26/2024     2 DOS: the patient was seen and examined on 12/30/2024   Brief hospital course: Bryan Sloan is a 54 y.o. male with medical history significant of morbid obesity with BMI 96.98 (BW 333.4 KG), HTN, peripheral neuropathy, who presents with SOB.  He had hypoxemia with oxygen saturation 88%, was placed on 4 L oxygen. Patient also was reported to have atrial fibrillation by EMS, but no strips recorded. Patient also had a drop blood pressure down to 77/55, improved after fluids. Due to high risk of PE, patient was placed on heparin  drip.  Not able to perform CT angiogram due to massive body weight.       Assessment and Plan:  Acute respiratory failure with hypoxemia. Mild elevation troponin with myocardial injury secondary to demand ischemia. Transient hypotension. Patient chronically has short of breath with minimal exertion, but shortness of breath much worse for the last 3 to 4 days.  Etiology is still unclear.  Patient does not have acute coronary syndrome, does not have evidence of exacerbation congestive heart failure. Never been diagnosed with COPD, no bronchospasm. Patient has a high risk of PE with elevated D-dimer, could not perform CT angiogram or VQ scan due to body weight. Heparin  was started and patient has been transitioned to Eliquis .  Due to concern of A-fib, Eliquis  probably should be continued indefinitely. Patient has been weaned off oxygen but will need to be assessed for home oxygen need prior to discharge. Due to nocturnal hypoxia, a NODS study has been requested to make a determination for home oxygen    Possible paroxysmal atrial fibrillation. Reviewed EKG performed this morning, patient is in sinus.  Due to concurrent suspicion of PE, patient will be treated with anticoagulation chronically.   Class III obesity with BMI 96.97. Patient probably has undiagnosed obstructive sleep  apnea, but ABG did not show any CO2 retention.   Will perform overnight oximetry for nocturnal oxygen use.   Patient can get outpatient sleep study with PCP.   Severe lactic acidosis. Likely secondary to hypoxia, condition improved.   Hypokalemia. Hyponatremia. Chronic kidney disease stage ii Diarrhea. Diarrhea has resolved Improved sodium levels with fluid restriction and sodium tablets supplementation          Subjective: No new complaints  Physical Exam: Vitals:   12/30/24 0023 12/30/24 0323 12/30/24 0857 12/30/24 1300  BP: 137/83 (!) 135/90 132/73 107/67  Pulse: 95 (!) 105 100 96  Resp: 19 20 18 18   Temp: 98 F (36.7 C) 97.6 F (36.4 C) 97.6 F (36.4 C) 98.1 F (36.7 C)  TempSrc: Oral  Oral Oral  SpO2: 100% 98% 99% 95%  Weight:      Height:       General exam: Appears calm and comfortable, morbidly obese. Respiratory system: Decreased breath sounds. Respiratory effort normal. Cardiovascular system: S1 & S2 heard, RRR. No JVD, murmurs, rubs, gallops or clicks. No pedal edema. Gastrointestinal system: Abdomen is nondistended, soft and nontender. No organomegaly or masses felt. Normal bowel sounds heard. Central nervous system: Alert and oriented. No focal neurological deficits. Extremities: Symmetric 5 x 5 power. Skin: No rashes, lesions or ulcers Psychiatry: Judgement and insight appear normal. Mood & affect appropriate.      Data Reviewed: Labs reviewed Sodium 130,  Family Communication: Plan of care was discussed with patient in detail.  He verbalizes understanding and agrees with the plan  Disposition: Status  is: Inpatient Remains inpatient appropriate because: For discharge in a.m.  Planned Discharge Destination: Home    Time spent: 45 minutes  Author: Aimee Somerset, MD 12/30/2024 3:20 PM  For on call review [email or web address]  "

## 2024-12-31 ENCOUNTER — Other Ambulatory Visit: Payer: Self-pay

## 2024-12-31 LAB — CULTURE, BLOOD (ROUTINE X 2)
Culture: NO GROWTH
Culture: NO GROWTH

## 2024-12-31 LAB — BASIC METABOLIC PANEL WITH GFR
Anion gap: 10 (ref 5–15)
BUN: 15 mg/dL (ref 6–20)
CO2: 28 mmol/L (ref 22–32)
Calcium: 9.4 mg/dL (ref 8.9–10.3)
Chloride: 95 mmol/L — ABNORMAL LOW (ref 98–111)
Creatinine, Ser: 0.87 mg/dL (ref 0.61–1.24)
GFR, Estimated: >60 mL/min
Glucose, Bld: 116 mg/dL — ABNORMAL HIGH (ref 70–99)
Potassium: 4 mmol/L (ref 3.5–5.1)
Sodium: 132 mmol/L — ABNORMAL LOW (ref 135–145)

## 2024-12-31 LAB — MAGNESIUM: Magnesium: 2.2 mg/dL (ref 1.7–2.4)

## 2024-12-31 MED ORDER — APIXABAN 5 MG PO TABS
ORAL_TABLET | ORAL | 0 refills | Status: AC
  Filled 2024-12-31: qty 70, 30d supply, fill #0

## 2024-12-31 NOTE — Discharge Summary (Signed)
 " Physician Discharge Summary   Patient: Bryan Sloan MRN: [id number] DOB: 11-17-1971  Admit date:     12/26/2024  Discharge date: 12/31/24  Discharge Physician: Naevia Unterreiner   PCP: Patient, No Pcp Per   Recommendations at discharge:   Use home oxygen at bedtime as recommended Take anticoagulants as recommended Follow-up with PCP as an outpatient With the ER for evaluation of worsening symptoms  Discharge Diagnoses: Principal Problem:   Acute respiratory failure with hypoxia (HCC) Active Problems:   Myocardial injury   Hypotension   Elevated lactic acid level   Hypertension   Sinus tachycardia   Hypokalemia   PAD (peripheral artery disease)   Morbid obesity with body mass index (BMI) greater than or equal to 70 in adult [facility])   Hyponatremia  Resolved Problems:   * No resolved hospital problems. *  Hospital Course:  Bryan Sloan is a 54 y.o. male with medical history significant of morbid obesity with BMI 96.98 (BW 333.4 KG), HTN, peripheral neuropathy, who presents with SOB.  He had hypoxemia with oxygen saturation 88%, was placed on 4 L oxygen. Patient also was reported to have atrial fibrillation by EMS, but no strips recorded. Patient also had a drop blood pressure down to 77/55, improved after fluids. Due to high risk of PE, patient was placed on heparin  drip.  Not able to perform CT angiogram due to massive body weight.   Assessment and Plan:  Acute respiratory failure with hypoxemia. Mild elevation troponin with myocardial injury secondary to demand ischemia. Transient hypotension. Patient chronically has short of breath with minimal exertion, but shortness of breath much worse for the last 3 to 4 days prior to admission.  Etiology is still unclear.  Patient does not have acute coronary syndrome, does not have evidence of exacerbation congestive heart failure. Never been diagnosed with COPD, no bronchospasm. Patient has a high risk of PE with elevated D-dimer,  could not perform CT angiogram or VQ scan due to body weight. Heparin  was started and patient has been transitioned to Eliquis .  Due to concern of A-fib, Eliquis  probably should be continued indefinitely. Patient has been weaned off oxygen, he had nocturnal hypoxia, and a NODS study showed episodes of desaturation with pulse oximetry less than 88%.   Patient will benefit from home oxygen at bedtime     Possible paroxysmal atrial fibrillation. Reviewed repeat EKG, patient is in sinus.   Due to concurrent suspicion of PE, patient will be treated with anticoagulation chronically.   Class III obesity with BMI 96.97. Suspected obstructive sleep apnea Patient probably has undiagnosed obstructive sleep apnea, but ABG did not show any CO2 retention.   Patient can get outpatient sleep study with PCP.   Severe lactic acidosis. Likely secondary to hypoxia, condition improved.   Hypokalemia. Hyponatremia. Chronic kidney disease stage ii Diarrhea. Diarrhea has resolved Improved sodium levels with fluid restriction and sodium tablets supplementation                Consultants: None Procedures performed: None  Disposition: Home Diet recommendation:  Discharge Diet Orders (From admission, onward)     Start     Ordered   12/31/24 0000  Diet - low sodium heart healthy        12/31/24 1013           Cardiac diet DISCHARGE MEDICATION: Allergies as of 12/31/2024       Reactions   Sulfa Antibiotics Rash        Medication  List     STOP taking these medications    meloxicam  15 MG tablet Commonly known as: MOBIC    metaxalone  800 MG tablet Commonly known as: SKELAXIN    naproxen  500 MG tablet Commonly known as: NAPROSYN        TAKE these medications    amLODipine-benazepril 10-20 MG capsule Commonly known as: LOTREL Take 1 capsule by mouth daily.   Eliquis  5 MG Tabs tablet Generic drug: apixaban  Take 2 tablets (10 mg total) by mouth 2 (two) times daily for 5  days, THEN 1 tablet (5 mg total) 2 (two) times daily for 25 days. Start taking on: December 31, 2024   torsemide 20 MG tablet Commonly known as: DEMADEX Take 20 mg by mouth daily.               Durable Medical Equipment  (From admission, onward)           Start     Ordered   12/31/24 1012  DME Oxygen  Once       Question Answer Comment  Length of Need Lifetime   Mode or (Route) Nasal cannula   Liters per Minute 2   Frequency Only at night (stationary unit needed)   Oxygen conserving device Yes   Oxygen delivery system: Gas   Oxygen delivery system: Concentrator      12/31/24 1013            Discharge Exam: Filed Weights   12/26/24 1607  Weight: (!) 333.4 kg   General exam: Appears calm and comfortable, morbidly obese. Respiratory system: Decreased breath sounds. Respiratory effort normal. Cardiovascular system: S1 & S2 heard, RRR. No JVD, murmurs, rubs, gallops or clicks. No pedal edema. Gastrointestinal system: Central adiposity, abdomen is nondistended, soft and nontender. No organomegaly or masses felt. Normal bowel sounds heard. Central nervous system: Alert and oriented. No focal neurological deficits. Extremities: Symmetric 5 x 5 power. Skin: No rashes, lesions or ulcers Psychiatry: Judgement and insight appear normal. Mood & affect appropriate.        Condition at discharge: stable  The results of significant diagnostics from this hospitalization (including imaging, microbiology, ancillary and laboratory) are listed below for reference.   Imaging Studies: US  Venous Img Lower Bilateral Result Date: 12/26/2024 EXAM: ULTRASOUND DUPLEX OF THE BILATERAL LOWER EXTREMITY VEINS TECHNIQUE: Duplex ultrasound using B-mode/gray scaled imaging and Doppler spectral analysis and color flow was obtained of the deep venous structures of the bilateral lower extremity. COMPARISON: None available. CLINICAL HISTORY: Evaluate lower extremity edema. FINDINGS:  LIMITATIONS: Examination is slightly limited by the patient's body habitus with relatively poor visualization of the infrapopliteal vasculature. LEFT: The common femoral vein, femoral vein, popliteal vein, and posterior tibial vein demonstrate normal compressibility with normal color flow and spectral analysis. RIGHT: The common femoral vein, femoral vein, popliteal vein, and posterior tibial vein demonstrate normal compressibility with normal color flow and spectral analysis. IMPRESSION: 1. No evidence of deep venous thrombosis. 2. Slightly limited examination due to body habitus with relatively poor visualization of the infrapopliteal vasculature. Electronically signed by: Dorethia Molt MD MD 12/26/2024 05:17 PM EST RP Workstation: [id number]   DG Chest Port 1 View Result Date: 12/26/2024 CLINICAL DATA:  Hypoxia, shortness of breath, and angina. EXAM: PORTABLE CHEST 1 VIEW COMPARISON:  06/06/2017. FINDINGS: Examination is limited due to patient's body habitus. The heart is enlarged and the mediastinal contour is within normal limits. The pulmonary vasculature is distended. No consolidation, effusion, or pneumothorax is seen. No acute osseous abnormality. IMPRESSION:  Cardiomegaly with no active disease. Electronically Signed   By: Leita Birmingham M.D.   On: 12/26/2024 16:38    Microbiology: Results for orders placed or performed during the hospital encounter of 12/26/24  Resp panel by RT-PCR (RSV, Flu A&B, Covid) Anterior Nasal Swab     Status: None   Collection Time: 12/26/24  4:03 PM   Specimen: Anterior Nasal Swab  Result Value Ref Range Status   SARS Coronavirus 2 by RT PCR NEGATIVE NEGATIVE Final    Comment: (NOTE) SARS-CoV-2 target nucleic acids are NOT DETECTED.  The SARS-CoV-2 RNA is generally detectable in upper respiratory specimens during the acute phase of infection. The lowest concentration of SARS-CoV-2 viral copies this assay can detect is 138 copies/mL. A negative result does not  preclude SARS-Cov-2 infection and should not be used as the sole basis for treatment or other patient management decisions. A negative result may occur with  improper specimen collection/handling, submission of specimen other than nasopharyngeal swab, presence of viral mutation(s) within the areas targeted by this assay, and inadequate number of viral copies(<138 copies/mL). A negative result must be combined with clinical observations, patient history, and epidemiological information. The expected result is Negative.  Fact Sheet for Patients:  bloggercourse.com  Fact Sheet for Healthcare Providers:  seriousbroker.it  This test is no t yet approved or cleared by the [place]  FDA and  has been authorized for detection and/or diagnosis of SARS-CoV-2 by FDA under an Emergency Use Authorization (EUA). This EUA will remain  in effect (meaning this test can be used) for the duration of the COVID-19 declaration under Section 564(b)(1) of the Act, 21 U.S.C.section 360bbb-3(b)(1), unless the authorization is terminated  or revoked sooner.       Influenza A by PCR NEGATIVE NEGATIVE Final   Influenza B by PCR NEGATIVE NEGATIVE Final    Comment: (NOTE) The Xpert Xpress SARS-CoV-2/FLU/RSV plus assay is intended as an aid in the diagnosis of influenza from Nasopharyngeal swab specimens and should not be used as a sole basis for treatment. Nasal washings and aspirates are unacceptable for Xpert Xpress SARS-CoV-2/FLU/RSV testing.  Fact Sheet for Patients: bloggercourse.com  Fact Sheet for Healthcare Providers: seriousbroker.it  This test is not yet approved or cleared by the [place]  FDA and has been authorized for detection and/or diagnosis of SARS-CoV-2 by FDA under an Emergency Use Authorization (EUA). This EUA will remain in effect (meaning this test can be used) for the  duration of the COVID-19 declaration under Section 564(b)(1) of the Act, 21 U.S.C. section 360bbb-3(b)(1), unless the authorization is terminated or revoked.     Resp Syncytial Virus by PCR NEGATIVE NEGATIVE Final    Comment: (NOTE) Fact Sheet for Patients: bloggercourse.com  Fact Sheet for Healthcare Providers: seriousbroker.it  This test is not yet approved or cleared by the [place]  FDA and has been authorized for detection and/or diagnosis of SARS-CoV-2 by FDA under an Emergency Use Authorization (EUA). This EUA will remain in effect (meaning this test can be used) for the duration of the COVID-19 declaration under Section 564(b)(1) of the Act, 21 U.S.C. section 360bbb-3(b)(1), unless the authorization is terminated or revoked.  Performed at [facility], [address] [place], [place] [address]   Blood culture (routine x 2)     Status: None   Collection Time: 12/26/24  4:04 PM   Specimen: BLOOD  Result Value Ref Range Status   Specimen Description BLOOD BLOOD LEFT FOREARM  Final   Special Requests  Final    BOTTLES DRAWN AEROBIC AND ANAEROBIC Blood Culture results may not be optimal due to an inadequate volume of blood received in culture bottles   Culture   Final    NO GROWTH 5 DAYS Performed at [facility], [address] [place], [place] [address]    Report Status 12/31/2024 FINAL  Final  Blood culture (routine x 2)     Status: None   Collection Time: 12/26/24  4:09 PM   Specimen: BLOOD  Result Value Ref Range Status   Specimen Description BLOOD BLOOD RIGHT FOREARM  Final   Special Requests   Final    BOTTLES DRAWN AEROBIC AND ANAEROBIC Blood Culture results may not be optimal due to an inadequate volume of blood received in culture bottles   Culture   Final    NO GROWTH 5 DAYS Performed at [facility], [address] [place], [place] [address]    Report Status 12/31/2024  FINAL  Final  Respiratory (~20 pathogens) panel by PCR     Status: None   Collection Time: 12/27/24  5:05 AM   Specimen: Nasopharyngeal Swab; Respiratory  Result Value Ref Range Status   Adenovirus NOT DETECTED NOT DETECTED Final   Coronavirus 229E NOT DETECTED NOT DETECTED Final    Comment: (NOTE) The Coronavirus on the Respiratory Panel, DOES NOT test for the novel  Coronavirus (2019 nCoV)    Coronavirus HKU1 NOT DETECTED NOT DETECTED Final   Coronavirus NL63 NOT DETECTED NOT DETECTED Final   Coronavirus OC43 NOT DETECTED NOT DETECTED Final   Metapneumovirus NOT DETECTED NOT DETECTED Final   Rhinovirus / Enterovirus NOT DETECTED NOT DETECTED Final   Influenza A NOT DETECTED NOT DETECTED Final   Influenza B NOT DETECTED NOT DETECTED Final   Parainfluenza Virus 1 NOT DETECTED NOT DETECTED Final   Parainfluenza Virus 2 NOT DETECTED NOT DETECTED Final   Parainfluenza Virus 3 NOT DETECTED NOT DETECTED Final   Parainfluenza Virus 4 NOT DETECTED NOT DETECTED Final   Respiratory Syncytial Virus NOT DETECTED NOT DETECTED Final   Bordetella pertussis NOT DETECTED NOT DETECTED Final   Bordetella Parapertussis NOT DETECTED NOT DETECTED Final   Chlamydophila pneumoniae NOT DETECTED NOT DETECTED Final   Mycoplasma pneumoniae NOT DETECTED NOT DETECTED Final    Comment: Performed at [facility], [address], [place], [place] [address]  C Difficile Quick Screen w PCR reflex     Status: None   Collection Time: 12/28/24  3:00 PM   Specimen: STOOL  Result Value Ref Range Status   C Diff antigen NEGATIVE NEGATIVE Final   C Diff toxin NEGATIVE NEGATIVE Final   C Diff interpretation No C. difficile detected.  Final    Comment: Performed at [facility], [address] [place], [place] [address]  GI pathogen panel by PCR, stool     Status: None   Collection Time: 12/28/24  3:00 PM  Result Value Ref Range Status   Plesiomonas shigelloides NOT DETECTED NOT DETECTED Final    Yersinia enterocolitica NOT DETECTED NOT DETECTED Final   Vibrio NOT DETECTED NOT DETECTED Final   Enteropathogenic E coli NOT DETECTED NOT DETECTED Final   E coli (ETEC) LT/ST NOT DETECTED NOT DETECTED Final   E coli 0157 by PCR Not applicable NOT DETECTED Final   Cryptosporidium by PCR NOT DETECTED NOT DETECTED Final   Entamoeba histolytica NOT DETECTED NOT DETECTED Final   Adenovirus F 40/41 NOT DETECTED NOT DETECTED Final  Norovirus GI/GII NOT DETECTED NOT DETECTED Final   Sapovirus NOT DETECTED NOT DETECTED Final    Comment: (NOTE) Performed At: [facility] [address] [place], [place] [id number] Jennette Shorter MD Ey:[phone]    Vibrio cholerae NOT DETECTED NOT DETECTED Final   Campylobacter by PCR NOT DETECTED NOT DETECTED Final   Salmonella by PCR NOT DETECTED NOT DETECTED Final   E coli (STEC) NOT DETECTED NOT DETECTED Final   Enteroaggregative E coli NOT DETECTED NOT DETECTED Final   Shigella by PCR NOT DETECTED NOT DETECTED Final   Cyclospora cayetanensis NOT DETECTED NOT DETECTED Final   Astrovirus NOT DETECTED NOT DETECTED Final   G lamblia by PCR NOT DETECTED NOT DETECTED Final   Rotavirus A by PCR NOT DETECTED NOT DETECTED Final    Labs: CBC: Recent Labs  Lab 12/26/24 1603 12/27/24 0505 12/28/24 0500 12/29/24 0753  WBC 7.2 7.0 7.7 6.8  NEUTROABS 4.9  --   --   --   HGB 14.4 13.6 13.7 13.0  HCT 44.5 41.8 41.7 39.3  MCV 80.5 80.7 79.6* 79.2*  PLT 200 196 191 166   Basic Metabolic Panel: Recent Labs  Lab 12/26/24 1603 12/27/24 0505 12/28/24 0500 12/29/24 0753 12/30/24 0423 12/31/24 0552  NA 132* 129* 127* 127* 130* 132*  K 3.3* 4.2 3.9 3.8 4.1 4.0  CL 90* 92* 90* 90* 93* 95*  CO2 24 27 23 25 26 28   GLUCOSE 162* 124* 137* 135* 119* 116*  BUN 18 21* 27* 23* 20 15  CREATININE 1.21 1.39* 1.36* 1.01 1.06 0.87  CALCIUM 9.5 9.0 9.2 9.2 9.4 9.4  MG 1.9  --  1.8 1.9  --  2.2  PHOS  --  4.0  --   --   --   --    Liver Function  Tests: Recent Labs  Lab 12/26/24 1603  AST 40  ALT 18  ALKPHOS 59  BILITOT 1.0  PROT 8.7*  ALBUMIN 4.0   CBG: Recent Labs  Lab 12/29/24 2033  GLUCAP 124*    Discharge time spent: greater than 30 minutes.  Signed: Aimee Somerset, MD [facility] 12/31/2024 "

## 2024-12-31 NOTE — TOC Transition Note (Signed)
 Transition of Care [facility]) - Discharge Note   Patient Details  Name: Bryan Sloan MRN: [id number] Date of Birth: May 25, 1971  Transition of Care [facility]) CM/SW Contact:  Alvaro Louder, LCSW Phone Number: 12/31/2024, 2:02 PM   Clinical Narrative:   LCSWA confirmed with MD that patient is stable for discharge. LCSWA notified the patient and they are in agreement with discharge. LCSWA discussed PT recommendation of Home O2. Patient was agreeable, LCSWA reached out to [employer] coordinator to set up equipment delivery. Transport arranged with [facility] for next available.   TOC signing off           Patient Goals and CMS Choice            Discharge Placement                       Discharge Plan and Services Additional resources added to the After Visit Summary for                                       Social Drivers of Health (SDOH) Interventions SDOH Screenings   Food Insecurity: No Food Insecurity (12/29/2024)  Housing: Low Risk (12/29/2024)  Transportation Needs: Unmet Transportation Needs (12/29/2024)  Utilities: Not At Risk (12/29/2024)  Tobacco Use: Low Risk (12/26/2024)     Readmission Risk Interventions     No data to display

## 2024-12-31 NOTE — Care Management Important Message (Signed)
 Important Message  Patient Details  Name: Bryan Sloan MRN: [id number] Date of Birth: 1971-07-06   Important Message Given:  Yes - Medicare IM     [name] W, CMA 12/31/2024, 11:46 AM

## 2024-12-31 NOTE — Plan of Care (Signed)
" °  Problem: Education: Goal: Knowledge of General Education information will improve Description: Including pain rating scale, medication(s)/side effects and non-pharmacologic comfort measures Outcome: Progressing   Problem: Health Behavior/Discharge Planning: Goal: Ability to manage health-related needs will improve Outcome: Progressing   Problem: Clinical Measurements: Goal: Will remain free from infection Outcome: Progressing Goal: Diagnostic test results will improve Outcome: Progressing   Problem: Elimination: Goal: Will not experience complications related to bowel motility Outcome: Progressing   Problem: Pain Managment: Goal: General experience of comfort will improve and/or be controlled Outcome: Progressing   Problem: Safety: Goal: Ability to remain free from injury will improve Outcome: Progressing   "

## 2024-12-31 NOTE — Plan of Care (Signed)
